# Patient Record
Sex: Male | Born: 1985 | Race: Asian | Hispanic: No | Marital: Single
Health system: Southern US, Academic
[De-identification: ages and names within clinical notes are randomized; demographics above are authoritative.]

## PROBLEM LIST (undated history)

## (undated) ENCOUNTER — Encounter: Attending: Gastroenterology | Primary: Gastroenterology

## (undated) ENCOUNTER — Encounter

## (undated) ENCOUNTER — Ambulatory Visit

## (undated) ENCOUNTER — Encounter
Attending: Pharmacist Clinician (PhC)/ Clinical Pharmacy Specialist | Primary: Pharmacist Clinician (PhC)/ Clinical Pharmacy Specialist

## (undated) ENCOUNTER — Telehealth

## (undated) ENCOUNTER — Telehealth
Attending: Pharmacist Clinician (PhC)/ Clinical Pharmacy Specialist | Primary: Pharmacist Clinician (PhC)/ Clinical Pharmacy Specialist

## (undated) ENCOUNTER — Ambulatory Visit: Attending: Gastroenterology | Primary: Gastroenterology

## (undated) ENCOUNTER — Telehealth: Attending: Gastroenterology | Primary: Gastroenterology

## (undated) DIAGNOSIS — B1910 Unspecified viral hepatitis B without hepatic coma: Secondary | ICD-10-CM

## (undated) MED ORDER — PHYTONADIONE (VITAMIN K1) 100 MCG TABLET: Freq: Every day | ORAL | 0.00000 days

## (undated) MED ORDER — VITAMIN K2 100 MCG CAPSULE: ORAL | 0 days

## (undated) MED ORDER — MAGNESIUM 30 MG TABLET: Freq: Two times a day (BID) | ORAL | 0.00000 days

## (undated) MED ORDER — VITAMIN D3 ORAL: ORAL | 0 days

---
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
## 2007-08-20 ENCOUNTER — Ambulatory Visit (HOSPITAL_COMMUNITY): Admission: RE | Admit: 2007-08-20 | Discharge: 2007-08-20 | Payer: Self-pay | Admitting: Internal Medicine

## 2010-04-01 ENCOUNTER — Ambulatory Visit: Payer: Self-pay | Admitting: Gastroenterology

## 2010-04-15 ENCOUNTER — Emergency Department (HOSPITAL_COMMUNITY): Admission: EM | Admit: 2010-04-15 | Discharge: 2010-04-15 | Payer: Self-pay | Admitting: Emergency Medicine

## 2010-07-15 ENCOUNTER — Emergency Department (HOSPITAL_COMMUNITY): Admission: EM | Admit: 2010-07-15 | Discharge: 2010-07-15 | Payer: Self-pay | Admitting: Emergency Medicine

## 2011-01-20 LAB — CBC
Hemoglobin: 16.3 g/dL (ref 13.0–17.0)
MCV: 78.1 fL (ref 78.0–100.0)
RBC: 6.13 MIL/uL — ABNORMAL HIGH (ref 4.22–5.81)
RDW: 14.1 % (ref 11.5–15.5)
WBC: 7.7 10*3/uL (ref 4.0–10.5)

## 2011-01-20 LAB — COMPREHENSIVE METABOLIC PANEL
ALT: 42 U/L (ref 0–53)
AST: 28 U/L (ref 0–37)
Albumin: 3.7 g/dL (ref 3.5–5.2)
Calcium: 9.2 mg/dL (ref 8.4–10.5)
GFR calc Af Amer: >60 mL/min (ref >60)
Potassium: 3.7 mEq/L (ref 3.5–5.1)
Total Bilirubin: 0.5 mg/dL (ref 0.3–1.2)
Total Protein: 6.8 g/dL (ref 6.0–8.3)

## 2011-01-20 LAB — URINALYSIS, ROUTINE W REFLEX MICROSCOPIC
Bilirubin Urine: NEGATIVE
Glucose, UA: NEGATIVE mg/dL
Ketones, ur: NEGATIVE mg/dL
Protein, ur: NEGATIVE mg/dL
Specific Gravity, Urine: 1.013 (ref 1.005–1.030)
pH: 6.5 (ref 5.0–8.0)

## 2011-01-20 LAB — LIPASE, BLOOD: Lipase: 34 U/L (ref 11–59)

## 2011-01-24 LAB — DIFFERENTIAL
Basophils Absolute: 0.1 10*3/uL (ref 0.0–0.1)
Basophils Relative: 1 % (ref 0–1)
Lymphocytes Relative: 39 % (ref 12–46)
Monocytes Absolute: 0.8 10*3/uL (ref 0.1–1.0)
Neutro Abs: 3.1 10*3/uL (ref 1.7–7.7)
Neutrophils Relative %: 46 % (ref 43–77)

## 2011-01-24 LAB — URINALYSIS, ROUTINE W REFLEX MICROSCOPIC: Hgb urine dipstick: NEGATIVE

## 2011-01-24 LAB — CBC
Hemoglobin: 16.5 g/dL (ref 13.0–17.0)
MCHC: 33.3 g/dL (ref 30.0–36.0)
MCV: 80.9 fL (ref 78.0–100.0)
Platelets: 120 10*3/uL — ABNORMAL LOW (ref 150–400)
RBC: 6.13 MIL/uL — ABNORMAL HIGH (ref 4.22–5.81)
RDW: 14.5 % (ref 11.5–15.5)

## 2011-01-24 LAB — COMPREHENSIVE METABOLIC PANEL
ALT: 840 U/L — ABNORMAL HIGH (ref 0–53)
Albumin: 3.8 g/dL (ref 3.5–5.2)
Alkaline Phosphatase: 135 U/L — ABNORMAL HIGH (ref 39–117)
CO2: 30 mEq/L (ref 19–32)
Chloride: 103 mEq/L (ref 96–112)
Glucose, Bld: 132 mg/dL — ABNORMAL HIGH (ref 70–99)
Sodium: 137 mEq/L (ref 135–145)
Total Protein: 7.3 g/dL (ref 6.0–8.3)

## 2011-01-24 LAB — URINE CULTURE

## 2011-06-26 IMAGING — US US ABDOMEN COMPLETE
1 series · 13 of 25 positions shown · non-contrast
Comparison: 08/20/2007

CLINICAL DATA: 23-year-old male with abdominal pain. History of
hepatitis B.

ABDOMINAL ULTRASOUND COMPLETE

[Series 1: us abdomen complete · 0.23mm/px · 13 of 65 slices shown]
[im 1/65]
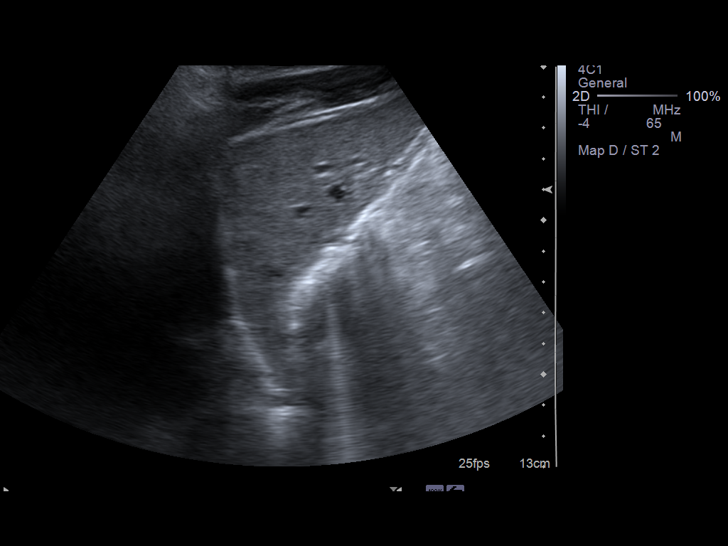
[im 6/65]
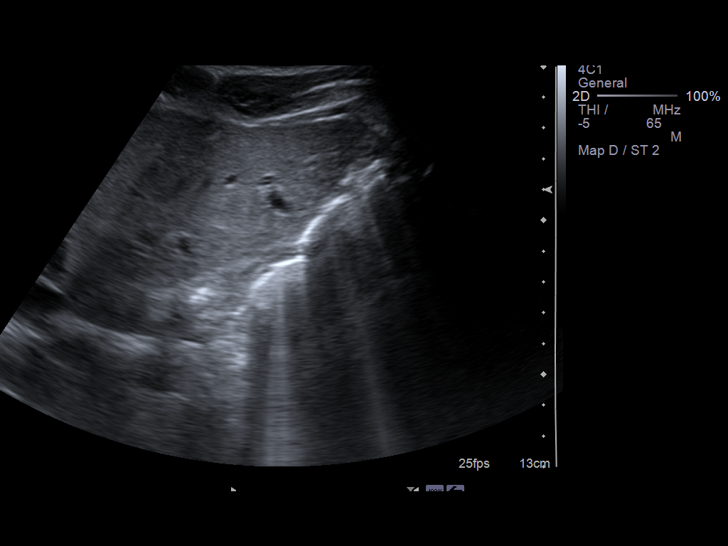
[im 11/65]
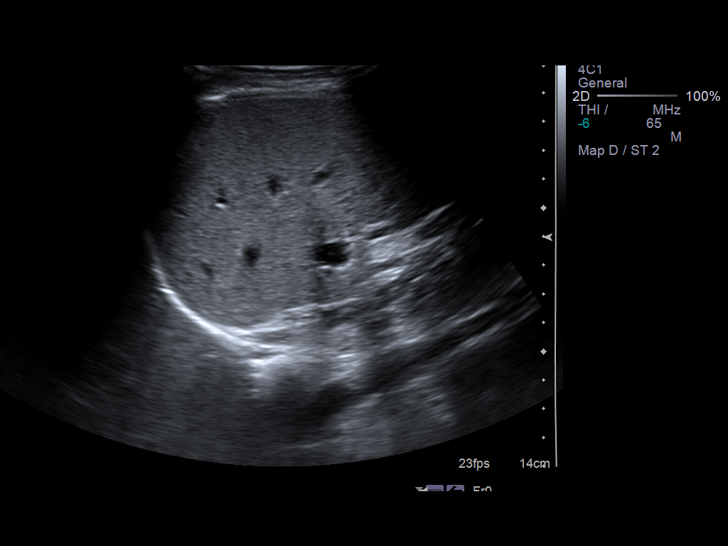
[im 17/65]
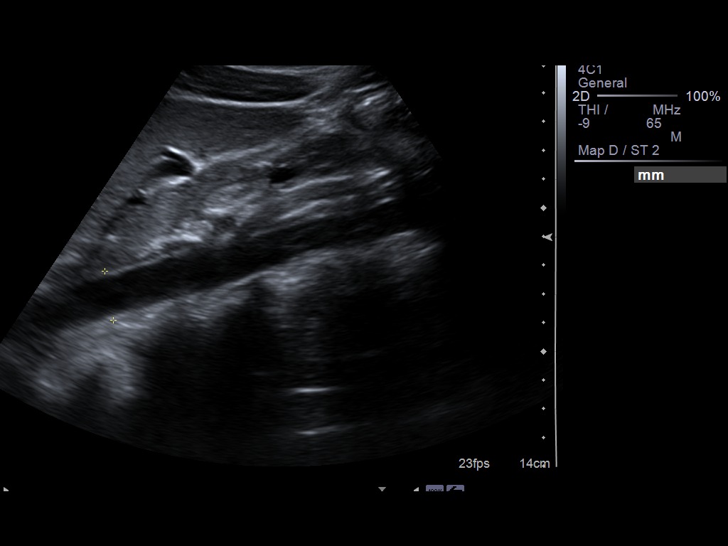
[im 22/65]
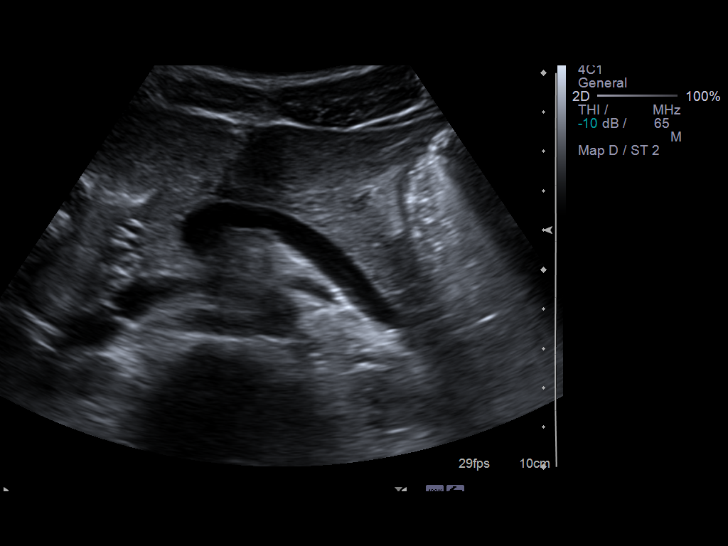
[im 27/65]
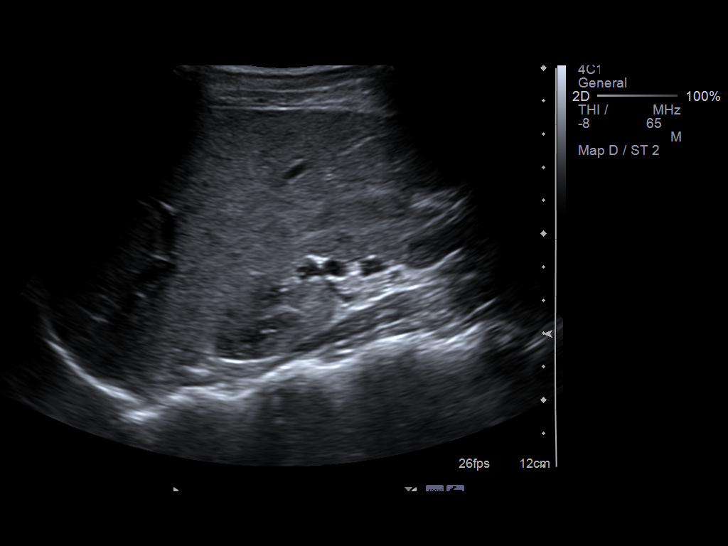
[im 33/65]
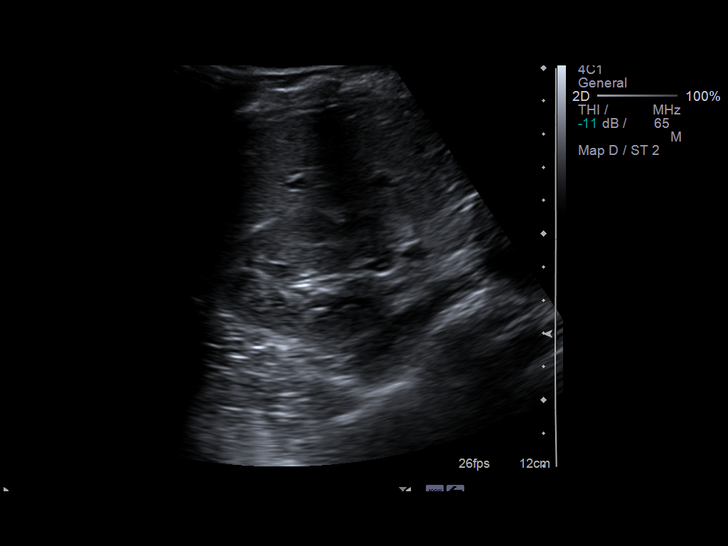
[im 38/65]
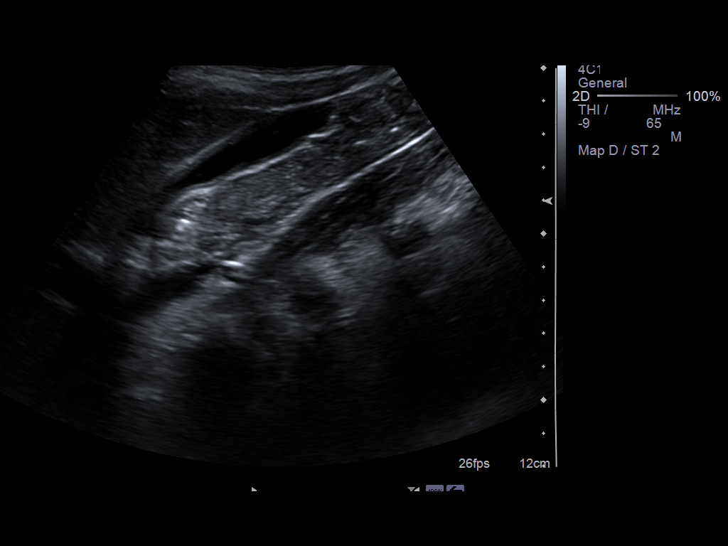
[im 43/65]
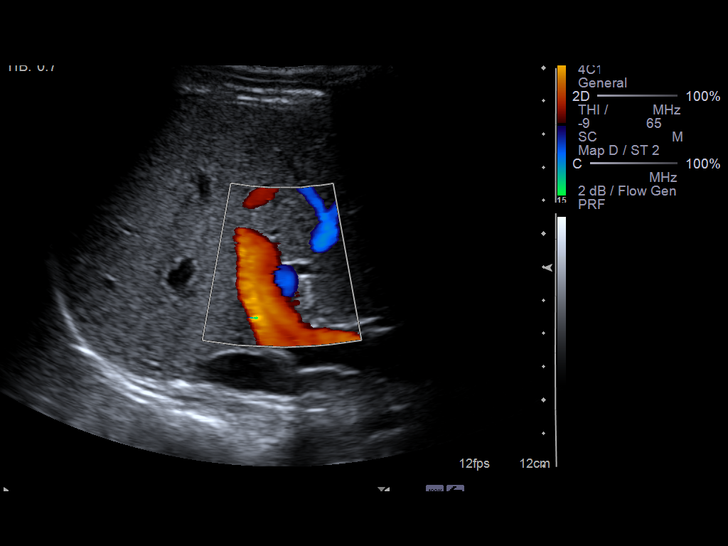
[im 49/65]
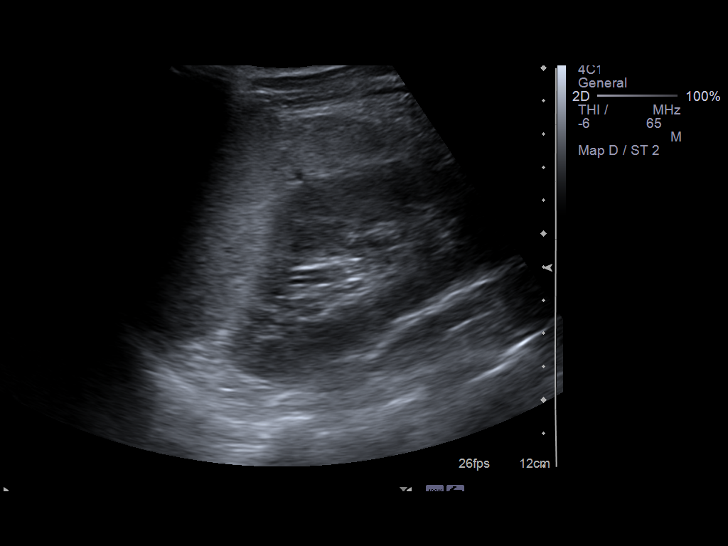
[im 54/65]
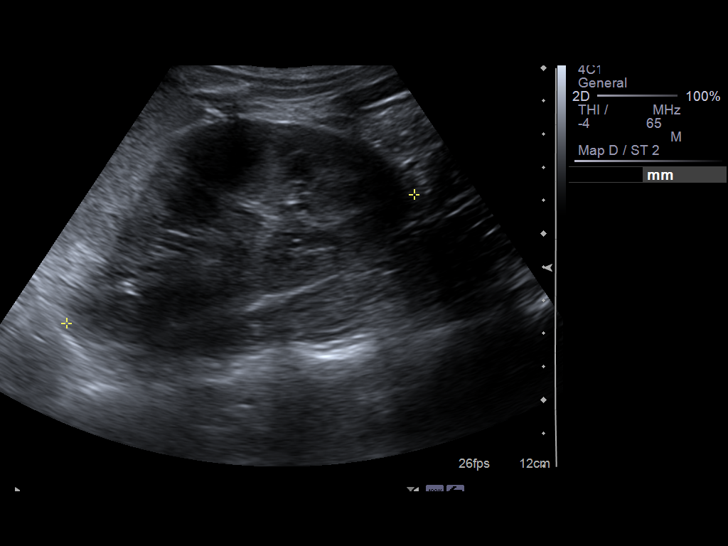
[im 59/65]
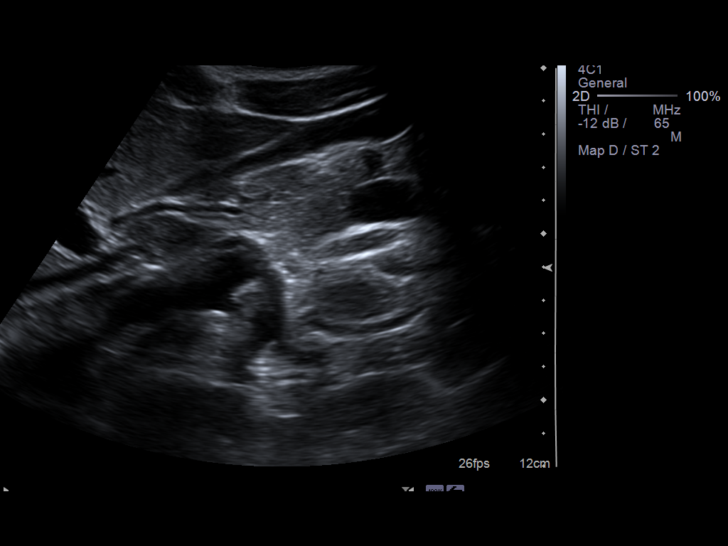
[im 65/65]
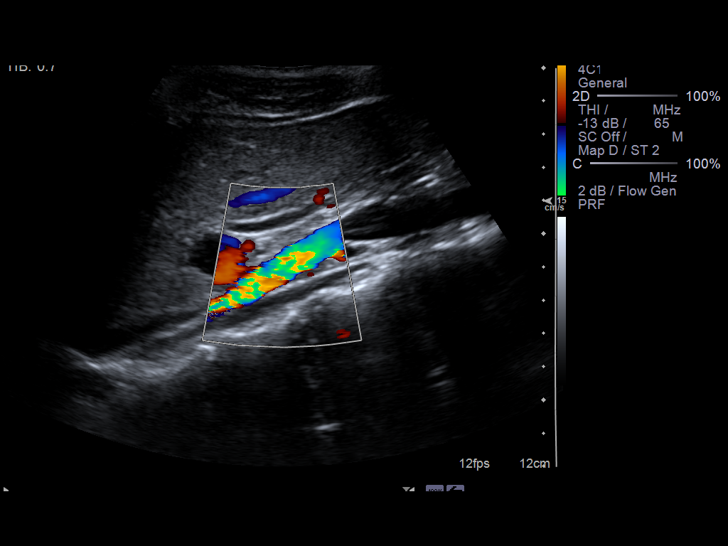

[13 of 25 positions shown; findings below may reference images not displayed]

FINDINGS: Gallbladder:  The gallbladder is contracted but unremarkable. There
is no evidence of gallstones, gallbladder wall thickening, or
pericholecystic fluid.

Common Bile Duct:  There is no evidence of intrahepatic or
extrahepatic biliary dilation. The CBD measures 3.0 mm in greatest
diameter.

Liver:  The liver is within normal limits in parenchymal
echogenicity. No focal abnormalities are identified.

IVC:  Appears normal.

Pancreas:  Although the pancreas is difficult to visualize in its
entirety, no focal pancreatic abnormality is identified.

Spleen:  Within normal limits in size and echotexture.

Right kidney:  The right kidney is normal in size and parenchymal
echogenicity.  There is no evidence of solid mass, hydronephrosis
or definite renal calculi.  The right kidney measures 11.5 cm.

Left kidney:  The left kidney is normal in size and parenchymal
echogenicity.  There is no evidence of solid mass, hydronephrosis
or definite renal calculi.   The left kidney measures 11.2 cm.

Abdominal Aorta:  No abdominal aortic aneurysm identified.

There is no evidence of ascites.
IMPRESSION: Negative abdominal ultrasound.

## 2012-03-15 ENCOUNTER — Other Ambulatory Visit: Payer: Self-pay | Admitting: Gastroenterology

## 2012-03-15 DIAGNOSIS — B1910 Unspecified viral hepatitis B without hepatic coma: Secondary | ICD-10-CM

## 2012-03-19 ENCOUNTER — Ambulatory Visit (HOSPITAL_COMMUNITY)
Admission: RE | Admit: 2012-03-19 | Discharge: 2012-03-19 | Disposition: A | Discharge status: Home / Self Care | Payer: 59 | Source: Ambulatory Visit | Attending: Gastroenterology | Admitting: Gastroenterology

## 2012-03-19 DIAGNOSIS — B1910 Unspecified viral hepatitis B without hepatic coma: Secondary | ICD-10-CM | POA: Insufficient documentation

## 2013-05-30 IMAGING — US US ABDOMEN COMPLETE
1 series · 14 of 25 positions shown · non-contrast
Comparison: 04/15/2010

CLINICAL DATA: History of hepatitis B

ABDOMINAL ULTRASOUND COMPLETE

[Series 1: us abdomen complete · 0.25mm/px · 14 of 67 slices shown]
[im 1/67]
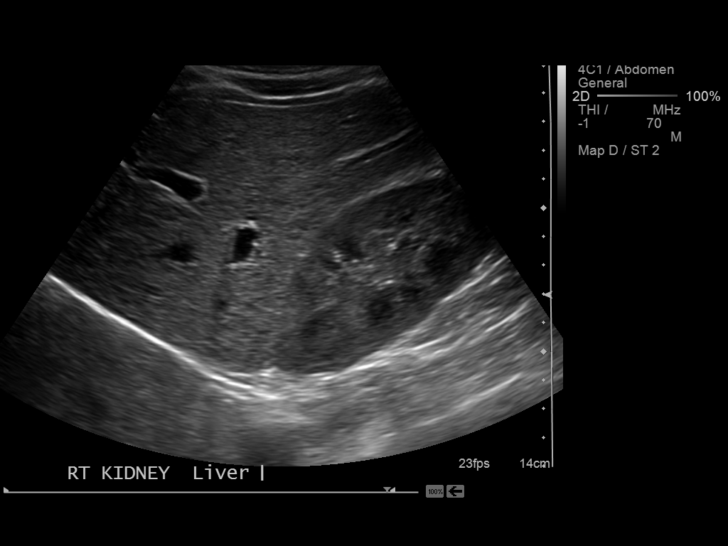
[im 6/67]
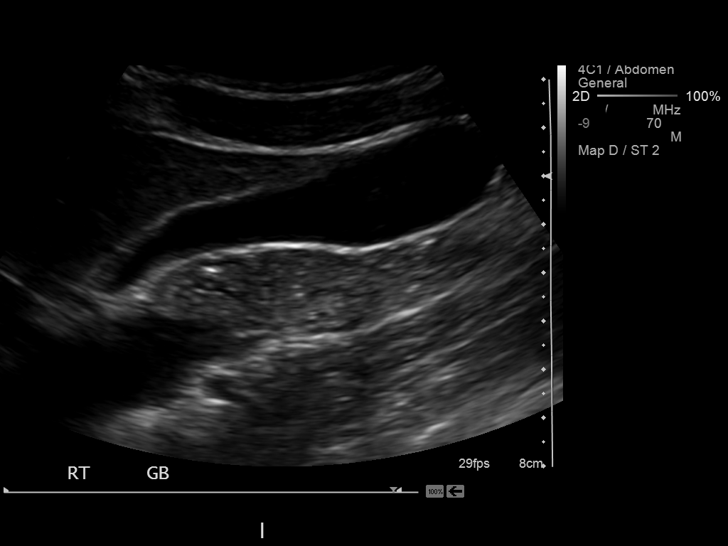
[im 12/67]
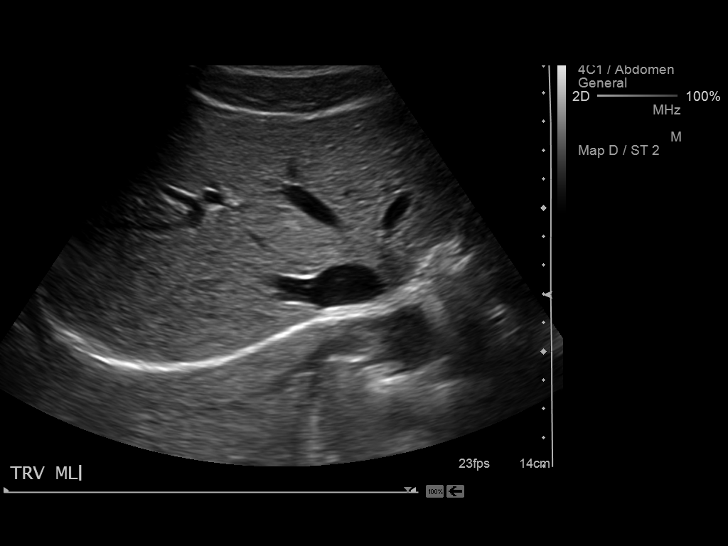
[im 17/67]
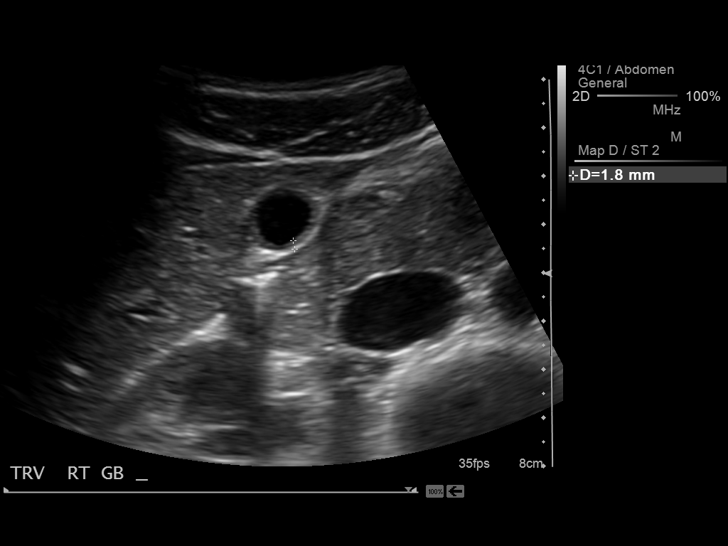
[im 23/67]
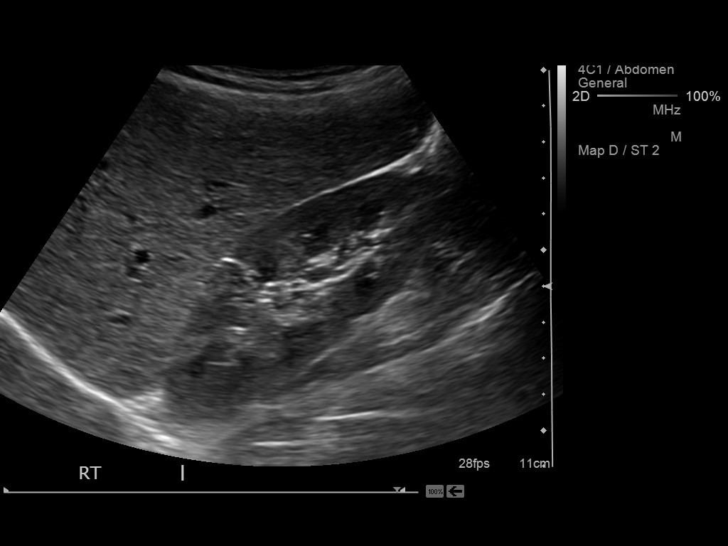
[im 25/67]
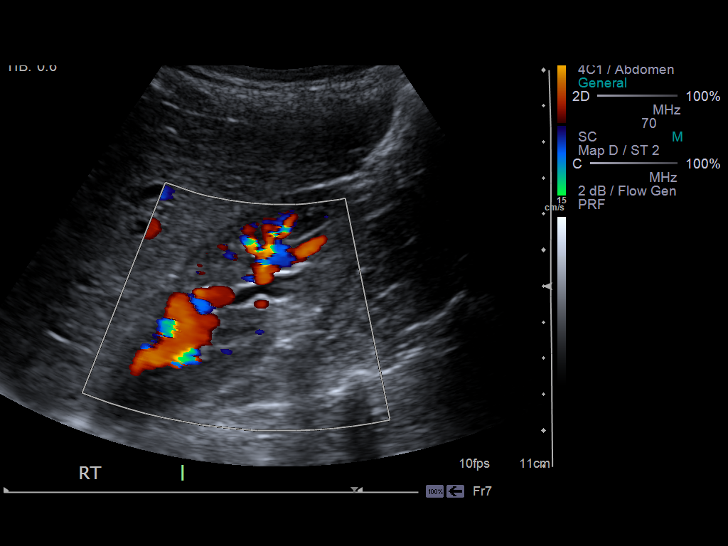
[im 31/67]
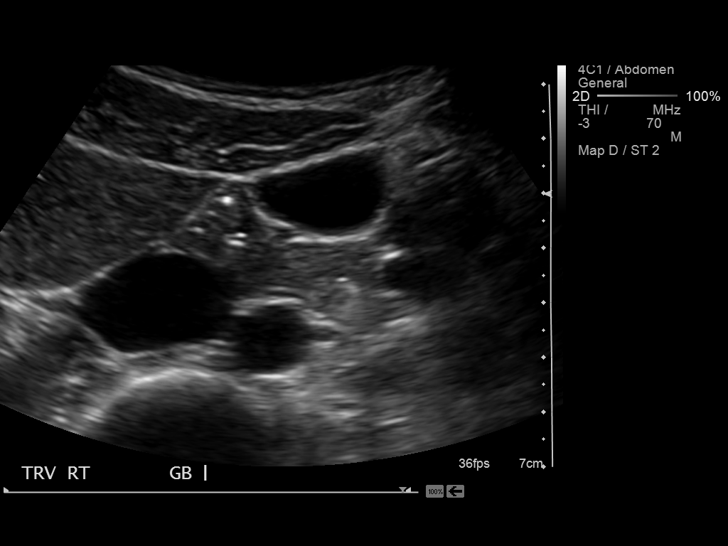
[im 36/67]
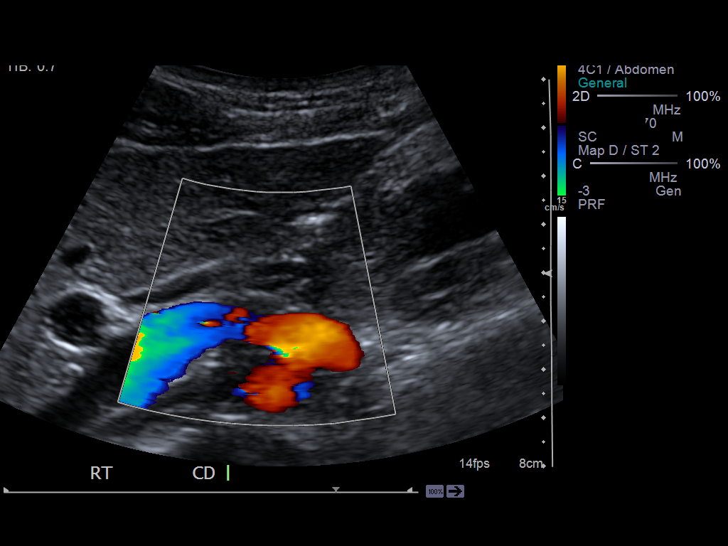
[im 42/67]
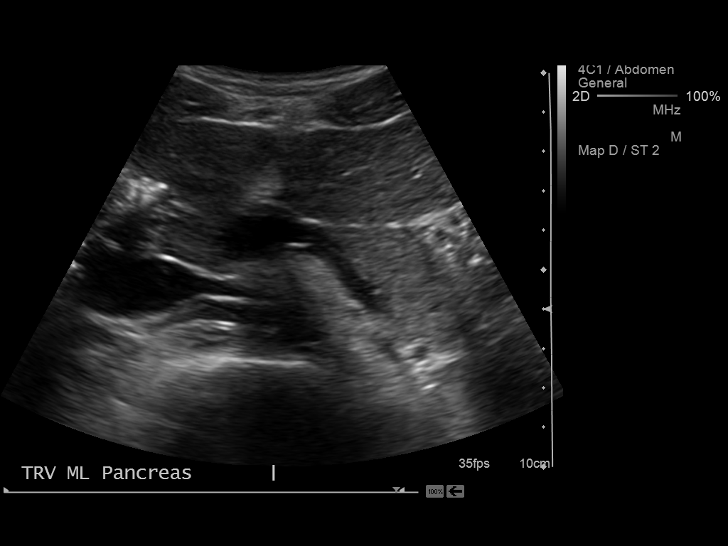
[im 45/67]
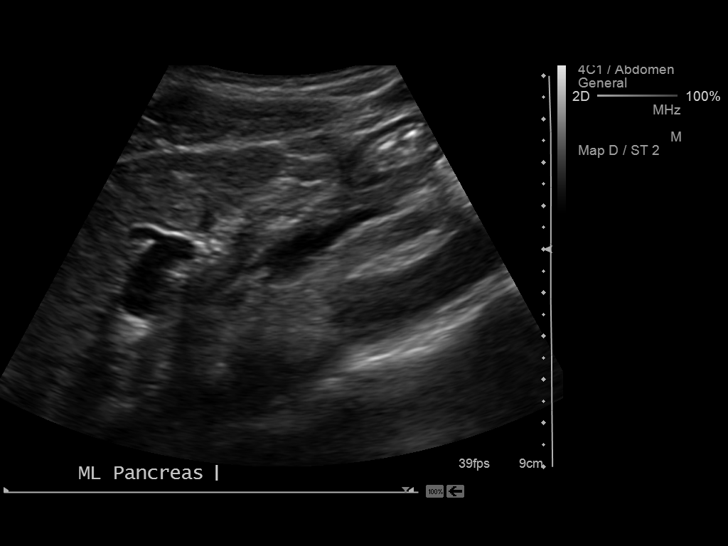
[im 50/67]
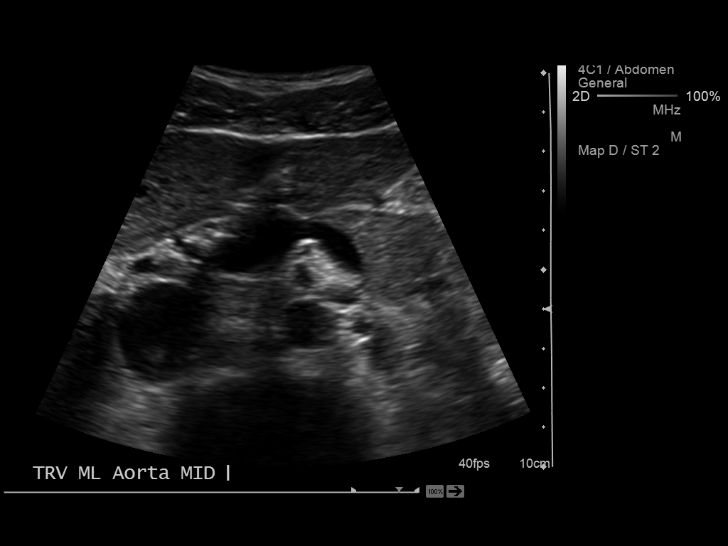
[im 56/67]
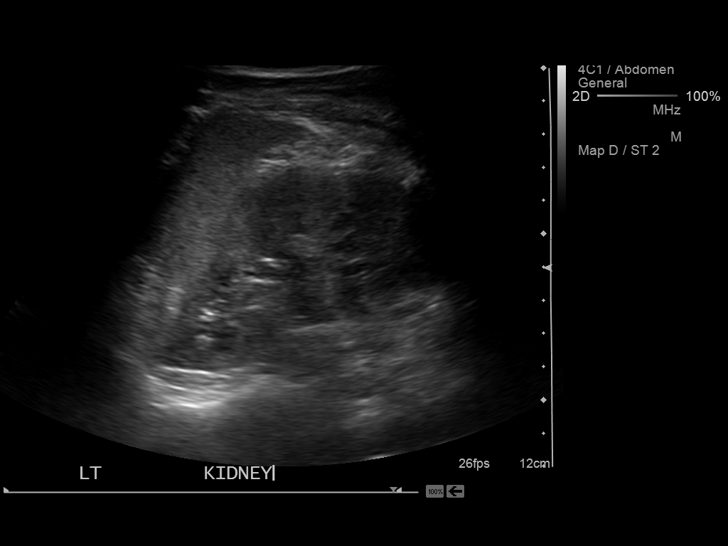
[im 61/67]
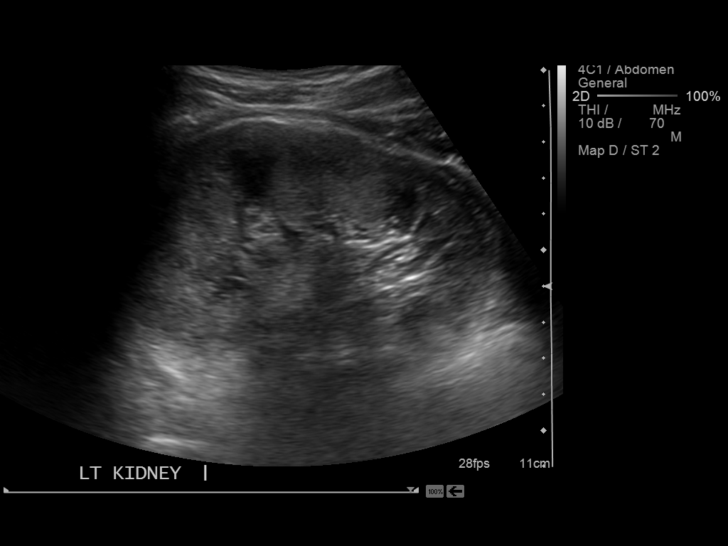
[im 67/67]
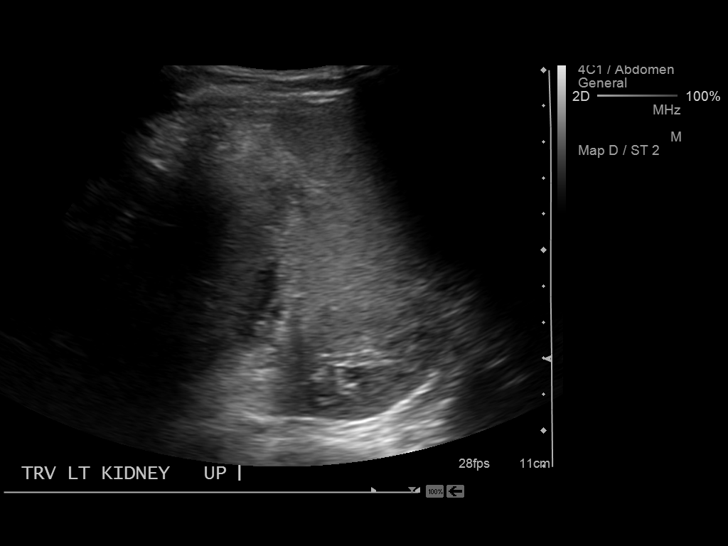

[14 of 25 positions shown; findings below may reference images not displayed]

FINDINGS: Gallbladder:  No gallstones, gallbladder wall thickening, or
pericholecystic fluid.

Common Bile Duct:  Within normal limits in caliber.

Liver: No focal mass lesion identified.  Within normal limits in
parenchymal echogenicity.

IVC:  Appears normal.

Pancreas:  No abnormality identified.

Spleen:  Within normal limits in size and echotexture.

Right kidney:  Normal in size and parenchymal echogenicity.  No
evidence of mass or hydronephrosis.

Left kidney:  Normal in size and parenchymal echogenicity.  No
evidence of mass or hydronephrosis.

Abdominal Aorta:  No aneurysm identified.
IMPRESSION: Negative abdominal ultrasound.

## 2016-02-09 ENCOUNTER — Emergency Department (HOSPITAL_COMMUNITY)
Admission: EM | Admit: 2016-02-09 | Discharge: 2016-02-09 | Disposition: A | Discharge status: Home / Self Care | Payer: 59 | Attending: Emergency Medicine | Admitting: Emergency Medicine

## 2016-02-09 ENCOUNTER — Encounter (HOSPITAL_COMMUNITY): Payer: Self-pay

## 2016-02-09 ENCOUNTER — Emergency Department (HOSPITAL_COMMUNITY): Payer: 59

## 2016-02-09 DIAGNOSIS — Z8619 Personal history of other infectious and parasitic diseases: Secondary | ICD-10-CM | POA: Insufficient documentation

## 2016-02-09 DIAGNOSIS — Z79899 Other long term (current) drug therapy: Secondary | ICD-10-CM | POA: Diagnosis not present

## 2016-02-09 DIAGNOSIS — R1030 Lower abdominal pain, unspecified: Secondary | ICD-10-CM | POA: Diagnosis not present

## 2016-02-09 DIAGNOSIS — R109 Unspecified abdominal pain: Secondary | ICD-10-CM | POA: Diagnosis present

## 2016-02-09 DIAGNOSIS — R000 Tachycardia, unspecified: Secondary | ICD-10-CM | POA: Diagnosis not present

## 2016-02-09 DIAGNOSIS — F17200 Nicotine dependence, unspecified, uncomplicated: Secondary | ICD-10-CM | POA: Insufficient documentation

## 2016-02-09 DIAGNOSIS — R1033 Periumbilical pain: Secondary | ICD-10-CM | POA: Insufficient documentation

## 2016-02-09 HISTORY — DX: Unspecified viral hepatitis B without hepatic coma: B19.10

## 2016-02-09 LAB — URINALYSIS, ROUTINE W REFLEX MICROSCOPIC
Bilirubin Urine: NEGATIVE
GLUCOSE, UA: NEGATIVE mg/dL
Hgb urine dipstick: NEGATIVE
KETONES UR: 15 mg/dL — AB
LEUKOCYTES UA: NEGATIVE
NITRITE: NEGATIVE
PH: 5.5 (ref 5.0–8.0)
Protein, ur: NEGATIVE mg/dL
SPECIFIC GRAVITY, URINE: 1.023 (ref 1.005–1.030)

## 2016-02-09 LAB — COMPREHENSIVE METABOLIC PANEL
ALBUMIN: 4.5 g/dL (ref 3.5–5.0)
ALT: 43 U/L (ref 17–63)
AST: 32 U/L (ref 15–41)
Alkaline Phosphatase: 59 U/L (ref 38–126)
Anion gap: 11 (ref 5–15)
BUN: 11 mg/dL (ref 6–20)
CHLORIDE: 100 mmol/L — AB (ref 101–111)
CO2: 28 mmol/L (ref 22–32)
CREATININE: 1 mg/dL (ref 0.61–1.24)
Calcium: 9.2 mg/dL (ref 8.9–10.3)
GFR calc Af Amer: >60 mL/min (ref >60)
GFR calc non Af Amer: >60 mL/min (ref >60)
GLUCOSE: 78 mg/dL (ref 65–99)
POTASSIUM: 3.5 mmol/L (ref 3.5–5.1)
SODIUM: 139 mmol/L (ref 135–145)
Total Bilirubin: 0.9 mg/dL (ref 0.3–1.2)
Total Protein: 8.1 g/dL (ref 6.5–8.1)

## 2016-02-09 LAB — CBC
HEMATOCRIT: 49 % (ref 39.0–52.0)
Hemoglobin: 16.1 g/dL (ref 13.0–17.0)
MCH: 24.9 pg — ABNORMAL LOW (ref 26.0–34.0)
MCHC: 32.9 g/dL (ref 30.0–36.0)
MCV: 75.7 fL — AB (ref 78.0–100.0)
Platelets: 160 10*3/uL (ref 150–400)
RBC: 6.47 MIL/uL — ABNORMAL HIGH (ref 4.22–5.81)
RDW: 14 % (ref 11.5–15.5)
WBC: 7.8 10*3/uL (ref 4.0–10.5)

## 2016-02-09 LAB — LIPASE, BLOOD: LIPASE: 35 U/L (ref 11–51)

## 2016-02-09 LAB — RAPID STREP SCREEN (MED CTR MEBANE ONLY): Streptococcus, Group A Screen (Direct): NEGATIVE

## 2016-02-09 LAB — MONONUCLEOSIS SCREEN: MONO SCREEN: NEGATIVE

## 2016-02-09 MED ORDER — IBUPROFEN 800 MG PO TABS
800.0000 mg | ORAL_TABLET | Freq: Once | ORAL | Status: AC
  Administered 2016-02-09: 800 mg via ORAL
  Filled 2016-02-09: qty 1

## 2016-02-09 MED ORDER — TRAMADOL HCL 50 MG PO TABS: 50.0000 mg | ORAL_TABLET | Freq: Four times a day (QID) | ORAL | Status: DC | PRN

## 2016-02-09 NOTE — ED Notes (Addendum)
Pt reports all over abdominal pain that originated in his upper back, onset on Friday. Pt denies N/V/D but reports sore throat. He went to Urgent Care yesterday and was told to come to ER if symptoms do not resolve.

## 2016-02-09 NOTE — ED Notes (Signed)
Pt ambulates independently and with steady gait at time of discharge. Discharge instructions and follow up information reviewed with patient. No other questions or concerns voiced at this time. RX x 1. 

## 2016-02-09 NOTE — Discharge Instructions (Signed)

## 2016-02-09 NOTE — ED Provider Notes (Signed)
CSN: [id number]     Arrival date & time 02/09/16  1552 History   First MD Initiated Contact with Patient 02/09/16 1929     Chief Complaint  Patient presents with  . Abdominal Pain     (Consider location/radiation/quality/duration/timing/severity/associated sxs/prior Treatment) HPI Comments: The patient is a 30 year old male with a history of hepatitis B diagnosed approximately 6 years ago he presents to the hospital today with abdominal pain which is been present for 5 days. Initially this started in his chest and radiated to his back but over the last few days it is moved down towards his belly button. He is able to eat and drink, there is no nausea vomiting or diarrhea but he has developed a sore throat. He went to the urgent care and was told to come here if his symptoms worsened yesterday. He states that there gradually worsening, but the symptoms are relatively the same, he has not taken any medications for this. He denies coughing, swelling, rashes, dysuria but states his stools have gone from normal bulky brown stools 2 slightly loose and falling apart  Patient is a 30 y.o. male presenting with abdominal pain. The history is provided by the patient.  Abdominal Pain   Past Medical History  Diagnosis Date  . Hepatitis B    History reviewed. No pertinent past surgical history. No family history on file. Social History  Substance Use Topics  . Smoking status: Current Every Day Smoker  . Smokeless tobacco: None  . Alcohol Use: Yes    Review of Systems  Gastrointestinal: Positive for abdominal pain.  All other systems reviewed and are negative.     Allergies  Review of patient's allergies indicates no known allergies.  Home Medications   Prior to Admission medications   Medication Sig Start Date End Date Taking? Authorizing Provider  tenofovir (VIREAD) 300 MG tablet Take 300 mg by mouth daily.   Yes Historical Provider, MD  traMADol (ULTRAM) 50 MG tablet Take 1 tablet (50  mg total) by mouth every 6 (six) hours as needed. 02/09/16   Eber HongBrian Evania Lyne, MD   BP 112/72 mmHg  Pulse 97  Temp(Src) 98.3 F (36.8 C) (Oral)  Resp 15  SpO2 99% Physical Exam  Constitutional: He appears well-developed and well-nourished. No distress.  HENT:  Head: Normocephalic and atraumatic.  Mouth/Throat: Oropharynx is clear and moist. No oropharyngeal exudate.  Erythema of the posterior pharynx without exudate asymmetry hypertrophy or uvula deviation. There are some aphthous ulcers present  Eyes: Conjunctivae and EOM are normal. Pupils are equal, round, and reactive to light. Right eye exhibits no discharge. Left eye exhibits no discharge. No scleral icterus.  Neck: Normal range of motion. Neck supple. No JVD present. No thyromegaly present.  No lymphadenopathy of the neck other than one posterior cervical lymph node which is nontender but slightly enlarged  Cardiovascular: Regular rhythm, normal heart sounds and intact distal pulses.  Exam reveals no gallop and no friction rub.   No murmur heard. Tachycardic to 110  Pulmonary/Chest: Effort normal and breath sounds normal. No respiratory distress. He has no wheezes. He has no rales.  Abdominal: Soft. Bowel sounds are normal. He exhibits no distension and no mass. There is tenderness ( Mild tenderness in the suprapubic and periumbilical region, no Murphy sign, no pain at McBurney's point, no tenderness in the left upper quadrant).  Musculoskeletal: Normal range of motion. He exhibits no edema or tenderness.  Lymphadenopathy:    He has no cervical adenopathy.  Neurological:  He is alert. Coordination normal.  Skin: Skin is warm and dry. No rash noted. No erythema.  Psychiatric: He has a normal mood and affect. His behavior is normal.  Nursing note and vitals reviewed.   ED Course  Procedures (including critical care time) Labs Review Labs Reviewed  COMPREHENSIVE METABOLIC PANEL - Abnormal; Notable for the following:    Chloride 100  (*)    All other components within normal limits  CBC - Abnormal; Notable for the following:    RBC 6.47 (*)    MCV 75.7 (*)    MCH 24.9 (*)    All other components within normal limits  URINALYSIS, ROUTINE W REFLEX MICROSCOPIC (NOT AT [facility]) - Abnormal; Notable for the following:    Ketones, ur 15 (*)    All other components within normal limits  RAPID STREP SCREEN (NOT AT [facility])  CULTURE, GROUP A STREP ([facility])  LIPASE, BLOOD  MONONUCLEOSIS SCREEN    Imaging Review US Abdomen Limited Ruq  02/09/2016  CLINICAL DATA:  Abdominal pain for 4 days. EXAM: US ABDOMEN LIMITED - RIGHT UPPER QUADRANT COMPARISON:  Abdominal ultrasound 03/19/2012 FINDINGS: Gallbladder: Elongated, unchanged from prior exam. No gallstones or wall thickening visualized. No sonographic Murphy sign noted by sonographer. Common bile duct: Diameter: 1-2 mm, normal. Liver: No focal lesion identified. Within normal limits in parenchymal echogenicity. Normal directional flow in the main portal vein. IMPRESSION: Normal right upper quadrant ultrasound. Electronically Signed   By: Rubye Oaks M.D.   On: 02/09/2016 22:19   I have personally reviewed and evaluated these images and lab results as part of my medical decision-making.    MDM   Final diagnoses:  Abdominal pain    The patient is well-appearing overall, he does not have a surgical abdomen, he does not have a leukocytosis and his liver function is normal. Lab work is reassuring, obtain ultrasound, Monospot and strep swab. The patient is agreeable to Motrin  Mono neg Strep neg [place] neg Labs reassuring - explained all to pt - in agreement to f/u closely.  Meds given in ED:  Medications  ibuprofen (ADVIL,MOTRIN) tablet 800 mg (800 mg Oral Given 02/09/16 2001)    New Prescriptions   TRAMADOL (ULTRAM) 50 MG TABLET    Take 1 tablet (50 mg total) by mouth every 6 (six) hours as needed.       Eber Hong, MD 02/09/16 [phone]

## 2016-02-12 ENCOUNTER — Encounter (HOSPITAL_COMMUNITY): Payer: Self-pay | Admitting: *Deleted

## 2016-02-12 ENCOUNTER — Emergency Department (HOSPITAL_COMMUNITY)
Admission: EM | Admit: 2016-02-12 | Discharge: 2016-02-13 | Disposition: A | Discharge status: Home / Self Care | Payer: 59 | Attending: Emergency Medicine | Admitting: Emergency Medicine

## 2016-02-12 DIAGNOSIS — F17200 Nicotine dependence, unspecified, uncomplicated: Secondary | ICD-10-CM | POA: Diagnosis not present

## 2016-02-12 DIAGNOSIS — Z79899 Other long term (current) drug therapy: Secondary | ICD-10-CM | POA: Diagnosis not present

## 2016-02-12 DIAGNOSIS — Z8619 Personal history of other infectious and parasitic diseases: Secondary | ICD-10-CM | POA: Insufficient documentation

## 2016-02-12 DIAGNOSIS — R112 Nausea with vomiting, unspecified: Secondary | ICD-10-CM | POA: Diagnosis not present

## 2016-02-12 DIAGNOSIS — J111 Influenza due to unidentified influenza virus with other respiratory manifestations: Secondary | ICD-10-CM | POA: Insufficient documentation

## 2016-02-12 DIAGNOSIS — R509 Fever, unspecified: Secondary | ICD-10-CM | POA: Diagnosis present

## 2016-02-12 LAB — CBC WITH DIFFERENTIAL/PLATELET
BASOS ABS: 0 10*3/uL (ref 0.0–0.1)
Basophils Relative: 0 %
EOS ABS: 0 10*3/uL (ref 0.0–0.7)
EOS PCT: 0 %
HCT: 47.2 % (ref 39.0–52.0)
Hemoglobin: 15.7 g/dL (ref 13.0–17.0)
LYMPHS PCT: 29 %
Lymphs Abs: 1.3 10*3/uL (ref 0.7–4.0)
MCH: 25 pg — AB (ref 26.0–34.0)
MCHC: 33.3 g/dL (ref 30.0–36.0)
MCV: 75 fL — AB (ref 78.0–100.0)
MONO ABS: 1.1 10*3/uL — AB (ref 0.1–1.0)
Monocytes Relative: 23 %
Neutro Abs: 2.1 10*3/uL (ref 1.7–7.7)
Neutrophils Relative %: 48 %
PLATELETS: 122 10*3/uL — AB (ref 150–400)
RBC: 6.29 MIL/uL — AB (ref 4.22–5.81)
RDW: 13.5 % (ref 11.5–15.5)
WBC: 4.5 10*3/uL (ref 4.0–10.5)

## 2016-02-12 LAB — CULTURE, GROUP A STREP (THRC)

## 2016-02-12 LAB — I-STAT CG4 LACTIC ACID, ED: LACTIC ACID, VENOUS: 1.25 mmol/L (ref 0.5–2.0)

## 2016-02-12 MED ORDER — ACETAMINOPHEN 325 MG PO TABS
650.0000 mg | ORAL_TABLET | Freq: Once | ORAL | Status: AC | PRN
  Administered 2016-02-12: 650 mg via ORAL
  Filled 2016-02-12: qty 2

## 2016-02-12 MED ORDER — ONDANSETRON 4 MG PO TBDP
8.0000 mg | ORAL_TABLET | Freq: Once | ORAL | Status: AC
  Administered 2016-02-13: 8 mg via ORAL
  Filled 2016-02-12: qty 2

## 2016-02-12 MED ORDER — ONDANSETRON HCL 8 MG PO TABS: 8.0000 mg | ORAL_TABLET | Freq: Three times a day (TID) | ORAL | Status: DC | PRN

## 2016-02-12 NOTE — ED Provider Notes (Signed)
CSN: [id number]     Arrival date & time 02/12/16  2017 History  By signing my name below, I, Placido SouLogan Joldersma, attest that this documentation has been prepared under the direction and in the presence of Mancel BaleElliott Oluwateniola Leitch, MD. Electronically Signed: Placido SouLogan Joldersma, ED Scribe. 02/12/2016. 11:44 PM.   Chief Complaint  Patient presents with  . multiple complaints    The history is provided by the patient. No language interpreter was used.    HPI Comments: Dillon Estrada is a 30 y.o. male with a PMHx of hepatitis B diagnosed 6 years ago who presents to the Emergency Department with multiple complaints onset 1 week ago. Pt [facility] and received a CXR with no acute abnormalities and was seen again 3 days ago with a fever, n/v, abd pain and decreased appetite. He now additionally complains of generalized weakness, sore throat, rhinorrhea and fatigue. Pt reports taking tenofovir for his hepatitis B and [facility]. He denies any diarrhea, dizziness or any other associated symptoms at this time.    Past Medical History  Diagnosis Date  . Hepatitis B    History reviewed. No pertinent past surgical history. No family history on file. Social History  Substance Use Topics  . Smoking status: Current Every Day Smoker  . Smokeless tobacco: None  . Alcohol Use: Yes    Review of Systems  Constitutional: Positive for fever and fatigue.  HENT: Positive for congestion, rhinorrhea and sore throat.   Gastrointestinal: Positive for nausea and vomiting. Negative for diarrhea.  Neurological: Positive for weakness. Negative for dizziness.  All other systems reviewed and are negative.  Allergies  Review of patient's allergies indicates no known allergies.  Home Medications   Prior to Admission medications   Medication Sig Start Date End Date Taking? Authorizing Provider  ondansetron (ZOFRAN) 8 MG tablet Take 1 tablet (8 mg total) by mouth every 8 (eight)  hours as needed for nausea or vomiting. 02/12/16   Mancel BaleElliott Ilka Lovick, MD  tenofovir (VIREAD) 300 MG tablet Take 300 mg by mouth daily.    Historical Provider, MD  traMADol (ULTRAM) 50 MG tablet Take 1 tablet (50 mg total) by mouth every 6 (six) hours as needed. 02/09/16   Eber HongBrian Miller, MD   BP 114/74 mmHg  Pulse 91  Temp(Src) 98.8 F (37.1 C) (Oral)  Resp 18  SpO2 98% Physical Exam  Constitutional: He is oriented to person, place, and time. He appears well-developed and well-nourished.  HENT:  Head: Normocephalic and atraumatic.  Right Ear: External ear normal.  Left Ear: External ear normal.  Nose: Rhinorrhea (clear) present.  Eyes: Conjunctivae and EOM are normal. Pupils are equal, round, and reactive to light.  Neck: Normal range of motion and phonation normal. Neck supple.  Cardiovascular: Normal rate, regular rhythm and normal heart sounds.   Pulmonary/Chest: Effort normal and breath sounds normal. He exhibits no bony tenderness.  Abdominal: Soft. There is no tenderness.  Genitourinary:  No CVA tenderness  Musculoskeletal: Normal range of motion. He exhibits no tenderness.  No musculoskeletal tenderness of back   Neurological: He is alert and oriented to person, place, and time. No cranial nerve deficit or sensory deficit. He exhibits normal muscle tone. Coordination normal.  Skin: Skin is warm, dry and intact.  Psychiatric: He has a normal mood and affect. His behavior is normal. Judgment and thought content normal.  Nursing note and vitals reviewed.  ED Course  Procedures  DIAGNOSTIC STUDIES: Oxygen Saturation is 99% on RA, normal by my interpretation.    COORDINATION OF CARE: 11:42 PM Discussed next steps with pt. He verbalized understanding and is agreeable with the plan.   Medications  acetaminophen (TYLENOL) tablet 650 mg (650 mg Oral Given 02/12/16 2051)  ondansetron (ZOFRAN-ODT) disintegrating tablet 8 mg (8 mg Oral Given 02/13/16 0004)    Patient Vitals for the past 24  hrs:  BP Temp Temp src Pulse Resp SpO2  02/12/16 2320 114/74 mmHg 98.8 F (37.1 C) Oral 91 18 98 %  02/12/16 2035 113/77 mmHg 101.3 F (38.5 C) Oral 107 20 99 %    At discharge-  Reevaluation with update and discussion. After initial assessment and treatment, an updated evaluation reveals no further complaints. Findings discussed with patient. Alyzza Andringa L    Labs Review Labs Reviewed  CBC WITH DIFFERENTIAL/PLATELET - Abnormal; Notable for the following:    RBC 6.29 (*)    MCV 75.0 (*)    MCH 25.0 (*)    Platelets 122 (*)    Monocytes Absolute 1.1 (*)    All other components within normal limits  I-STAT CG4 LACTIC ACID, ED    Imaging Review No results found. I have personally reviewed and evaluated these images and lab results as part of my medical decision-making.   EKG Interpretation None      MDM   Final diagnoses:  Influenza    Evaluation consistent with viral process, possibly influenza. No evidence for serious bacterial infection or metabolic instability.  Nursing Notes Reviewed/ Care Coordinated Applicable Imaging Reviewed Interpretation of Laboratory Data incorporated into ED treatment  The patient appears reasonably screened and/or stabilized for discharge and I doubt any other medical condition or other [facility] requiring further screening, evaluation, or treatment in the ED at this time prior to discharge.  Plan: Home Medications- OTC analgesia; Home Treatments- rest, fluids; return here if the recommended treatment, does not improve the symptoms; Recommended follow up- PCP prn   I personally performed the services described in this documentation, which was scribed in my presence. The recorded information has been reviewed and is accurate.      Mancel Bale, MD 02/13/16 [phone]

## 2016-02-12 NOTE — ED Notes (Signed)
The pt  Is c/i fever vomiting chest congestion aching all over.  [facility] Tuesday.  He reports that he is no better.  He has not taken advil or tylenol  Since tuesdaty

## 2016-02-12 NOTE — Discharge Instructions (Signed)
Take Tylenol for fever, every 4 hours. Start with a clear liquid diet, and gradually advance to bland foods in 1-2 days.   Influenza, Adult Influenza ("the flu") is a viral infection of the respiratory tract. It occurs more often in winter months because people spend more time in close contact with one another. Influenza can make you feel very sick. Influenza easily spreads from person to person (contagious). CAUSES  Influenza is caused by a virus that infects the respiratory tract. You can catch the virus by breathing in droplets from an infected person's cough or sneeze. You can also catch the virus by touching something that was recently contaminated with the virus and then touching your mouth, nose, or eyes. RISKS AND COMPLICATIONS You may be at risk for a more severe case of influenza if you smoke cigarettes, have diabetes, have chronic heart disease (such as heart failure) or lung disease (such as asthma), or if you have a weakened immune system. Elderly people and pregnant women are also at risk for more serious infections. The most common problem of influenza is a lung infection (pneumonia). Sometimes, this problem can require emergency medical care and may be life threatening. SIGNS AND SYMPTOMS  Symptoms typically last 4 to 10 days and may include:  Fever.  Chills.  Headache, body aches, and muscle aches.  Sore throat.  Chest discomfort and cough.  Poor appetite.  Weakness or feeling tired.  Dizziness.  Nausea or vomiting. DIAGNOSIS  Diagnosis of influenza is often made based on your history and a physical exam. A nose or throat swab test can be done to confirm the diagnosis. TREATMENT  In mild cases, influenza goes away on its own. Treatment is directed at relieving symptoms. For more severe cases, your health care provider may prescribe antiviral medicines to shorten the sickness. Antibiotic medicines are not effective because the infection is caused by a virus, not by  bacteria. HOME CARE INSTRUCTIONS  Take medicines only as directed by your health care provider.  Use a cool mist humidifier to make breathing easier.  Get plenty of rest until your temperature returns to normal. This usually takes 3 to 4 days.  Drink enough fluid to keep your urine clear or pale yellow.  Cover yourmouth and nosewhen coughing or sneezing,and wash your handswellto prevent thevirusfrom spreading.  Stay homefromwork orschool untilthe fever is gonefor at least 591full day. PREVENTION  An annual influenza vaccination (flu shot) is the best way to avoid getting influenza. An annual flu shot is now routinely recommended for all adults in the [place] SEEK MEDICAL CARE IF:  You experiencechest pain, yourcough worsens,or you producemore mucus.  Youhave nausea,vomiting, ordiarrhea.  Your fever returns or gets worse. SEEK IMMEDIATE MEDICAL CARE IF:  You havetrouble breathing, you become short of breath,or your skin ornails becomebluish.  You have severe painor stiffnessin the neck.  You develop a sudden headache, or pain in the face or ear.  You have nausea or vomiting that you cannot control. MAKE SURE YOU:   Understand these instructions.  Will watch your condition.  Will get help right away if you are not doing well or get worse.   This information is not intended to replace advice given to you by your health care provider. Make sure you discuss any questions you have with your health care provider.   Document Released: 10/21/2000 Document Revised: 11/14/2014 Document Reviewed: 01/23/2012 Elsevier Interactive Patient Education [employer]2016 Elsevier Inc.

## 2017-05-24 MED ORDER — TENOFOVIR DISOPROXIL FUMARATE 300 MG TABLET
ORAL_TABLET | Freq: Every day | ORAL | 0 refills | 0 days | Status: CP
Start: 2017-05-24 — End: 2017-06-14

## 2017-05-24 MED FILL — VIREAD/300MG/TAB: VIREAD/300MG/TAB | 30 days supply | Qty: 30 | Fill #0

## 2017-06-14 NOTE — Unmapped (Signed)
Specialty Pharmacy Refill Coordination Note     Steven Nelson is a 31 y.o. male contacted today regarding refills of his specialty medication(s).    Reviewed and verified with patient:     [address]  [name] [address]    Specialty medication(s) and dose(s) confirmed: yes  Changes to medications: no  Changes to insurance: no    Medication Adherence    Patient reported X missed doses in the last month:  0  Specialty Medication:  Viread  Medication Assistance Program  Refill Coordination  Has the Patient's Contact Information Changed:  No  Is the Shipping Address Different:  No  Shipping Information  Delivery Scheduled:  Yes  Delivery Date:  06/23/17  Medications to be Shipped:  Viread          Marzetta Board  Specialty Pharmacy Technician

## 2017-06-15 MED ORDER — VIREAD 300 MG TABLET
ORAL_TABLET | PRN refills | 0.00000 days | Status: CP
Start: 2017-06-15 — End: 2018-03-30

## 2017-06-15 MED ORDER — VIREAD 300 MG TABLET: each | 99 refills | 0 days

## 2017-06-19 ENCOUNTER — Ambulatory Visit
Admission: RE | Admit: 2017-06-19 | Discharge: 2017-06-19 | Disposition: A | Discharge status: Home / Self Care | Payer: Commercial Managed Care - PPO

## 2017-06-19 DIAGNOSIS — B181 Chronic viral hepatitis B without delta-agent: Principal | ICD-10-CM

## 2017-06-19 LAB — LIPID PANEL
CHOLESTEROL: 134 mg/dL (ref 100–199)
LDL CHOLESTEROL CALCULATED: 92 mg/dL (ref 60–99)
NON-HDL CHOLESTEROL: 101 mg/dL
VLDL CHOLESTEROL CAL: 8.8 mg/dL — ABNORMAL LOW (ref 9–40)

## 2017-06-19 LAB — CBC
HEMATOCRIT: 46.6 % (ref 41.0–53.0)
HEMOGLOBIN: 15.8 g/dL (ref 13.5–17.5)
MEAN CORPUSCULAR HEMOGLOBIN CONC: 33.8 g/dL (ref 31.0–37.0)
MEAN CORPUSCULAR HEMOGLOBIN: 25.9 pg — ABNORMAL LOW (ref 26.0–34.0)
MEAN CORPUSCULAR VOLUME: 76.5 fL — ABNORMAL LOW (ref 80.0–100.0)
MEAN PLATELET VOLUME: 7.4 fL (ref 7.0–10.0)
PLATELET COUNT: 216 10*9/L (ref 150–440)
RED CELL DISTRIBUTION WIDTH: 14.1 % (ref 12.0–15.0)
WBC ADJUSTED: 4.9 10*9/L (ref 4.5–11.0)

## 2017-06-19 LAB — GLUCOSE FASTING: Glucose^post CFst:MCnc:Pt:Ser/Plas:Qn:: 90

## 2017-06-19 LAB — WBC ADJUSTED: Lab: 4.9

## 2017-06-19 LAB — AFP-TUMOR MARKER: Alpha-1-Fetoprotein.tumor marker:MCnc:Pt:Ser/Plas:Qn:: 1.43

## 2017-06-19 LAB — ALKALINE PHOSPHATASE: Alkaline phosphatase:CCnc:Pt:Ser/Plas:Qn:: 83

## 2017-06-19 LAB — HDL CHOLESTEROL: Cholesterol.in HDL:MCnc:Pt:Ser/Plas:Qn:: 33 — ABNORMAL LOW

## 2017-06-19 LAB — BILIRUBIN TOTAL: Bilirubin:MCnc:Pt:Ser/Plas:Qn:: 0.3

## 2017-06-19 LAB — EGFR MDRD NON AF AMER: Glomerular filtration rate/1.73 sq M.predicted.non black:ArVRat:Pt:Ser/Plas/Bld:Qn:Creatinine-based formula (MDRD): >=60

## 2017-06-19 LAB — AST (SGOT): Aspartate aminotransferase:CCnc:Pt:Ser/Plas:Qn:: 33

## 2017-06-19 LAB — BLOOD UREA NITROGEN: Urea nitrogen:MCnc:Pt:Ser/Plas:Qn:: 12

## 2017-06-19 LAB — CREATININE: EGFR MDRD NON AF AMER: >=60 mL/min/{1.73_m2} (ref >=60)

## 2017-06-19 LAB — ALT (SGPT): Alanine aminotransferase:CCnc:Pt:Ser/Plas:Qn:: 45

## 2017-06-19 LAB — ALBUMIN: Albumin:MCnc:Pt:Ser/Plas:Qn:: 4.6

## 2017-06-20 LAB — INSULIN LEVEL: Chemistry studies:Cmplx:-:^Patient:Set:: 4.5

## 2017-06-20 NOTE — Unmapped (Unsigned)
[facility]      [phone]      Name:Steven Nelson   UJWJ:[id number]   Age:31 y.o.   Sex:M   Race:Asian       CHIEF COMPLAINT: Study followup HBRN IA protocol.     HISTORY OF PRESENT ILLNESS: Steven Nelson is a 31 y.o.  [place] with chronic HBV originally HBeAg + disease. He is at week #228 of HBV IA treatment study, now on treatment. His first PEG injection 03/30/12 for immune active treatment trial, randomized PEG and tenofovir PEG reduced to 135 mcg for side effects including weight loss. He completed PEG at week 24. He has continued per protocol on tenofovir alone. He has persistently undetectable HBV DNA although his Hep Be Ag status fluctuates from positive to negative at various times.    Steven Nelson presents to clinic today alone. He continues to feel well and tolerating tenofovir without difficulty.  He states he has been 100% adherent to tenofovir since last visit.       PAST MEDICAL HISTORY:   1. Chronic hepatitis B, genotype C.   2. Dyslipidemia.   3. Lactose intolerance   4. 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   5. HCC surveillance: Hepatic U/S 03/01/16- ??Mildly heterogeneous echotexture of the liver without focal hepatic mass.  6. HAV immune  7. 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 He works in Set [employer] currently.      FAMILY HISTORY: There is no family history of hepatocellular carcinoma.     ALLERGIES: No known drug allergies.   CURRENT MEDICATIONS:   Tenofovir daily.       PHYSICAL EXAMINATION:   BP 127/73 (BP Site: R Arm, BP Position: Sitting, BP Cuff Size: Medium)  - Pulse 74  - Temp 36.6 ??C (97.8 ??F) (Oral)  - Resp 12  - Wt 50.5 kg (111 lb 6.4 oz)  - BMI 18.54 kg/m??     GENERAL: This is a thin [place] male in no acute distress.   Pleasant individual in NAD    HEENT: Sclera are anicteric, no temporal muscle loss, oropharynx is negative  NECK: No thyromegaly or lymphadenopathy, No carotid bruits  Chest: Clear to auscultation and percussion  Heart: S1, S2, RR, No murmurs  Abdomen: Soft, non-tender, non-distended, no hepatosplenomegaly, no masses appreciated, no ascites  Skin: No spider angiomata, No rashes  Extremities: Without pedal edema, no palmar erythema  Neuro: Grossly intact, No focal deficits      Labs:  Results for orders placed or performed in visit on 06/19/17   BUN   Result Value Ref Range    BUN 12 7 - 21 mg/dL   Cr - Creatinine   Result Value Ref Range    Creatinine 0.82 0.70 - 1.30 mg/dL    EGFR MDRD Af Amer >=13 >=60 mL/min/1.38m2    EGFR MDRD Non Af Amer >=60 >=60 mL/min/1.15m2   Glucose, Fasting   Result Value Ref Range    Glucose, Fasting 90 65 - 99 mg/dL   Alb - Albumin   Result Value Ref Range    Albumin 4.6 3.5 - 5.0 g/dL   AST   Result Value Ref Range    AST 33 19 - 55 U/L   ALT   Result Value Ref Range    ALT 45 19 - 72 U/L   Alkaline Phosphatase   Result Value Ref Range    Alkaline Phosphatase 83 38 - 126 U/L  T.Bili - Bilirubin, total   Result Value Ref Range    Total Bilirubin 0.3 0.0 - 1.2 mg/dL   CBC   Result Value Ref Range    WBC 4.9 4.5 - 11.0 10*9/L    RBC 6.09 (H) 4.50 - 5.90 10*12/L    HGB 15.8 13.5 - 17.5 g/dL    HCT 16.1 09.6 - 04.5 %    MCV 76.5 (L) 80.0 - 100.0 fL    MCH 25.9 (L) 26.0 - 34.0 pg    MCHC 33.8 31.0 - 37.0 g/dL    RDW 40.9 81.1 - 91.4 %    MPV 7.4 7.0 - 10.0 fL    Platelet 216 150 - 440 10*9/L   Lipid panel, Non-Fasting** (**Incl Chol, Trigly, HDL, LDL)   Result Value Ref Range    Triglycerides 44 1 - 149 mg/dL    Cholesterol 782 956 - 199 mg/dL    HDL 33 (L) 40 - 59 mg/dL    LDL Calculated 92 60 - 99 mg/dL    VLDL Cholesterol Cal 8.8 (L) 9 - 40 mg/dL    Chol/HDL Ratio 4.1 <2.1    Non-HDL Cholesterol 101 mg/dL    FASTING Yes    AFP tumor marker   Result Value Ref Range    AFP-Tumor Marker 1.43 <7.51 ng/mL         ASSESSMENT: Patient is a 31 y.o. Asian male  Week# 336 of HBV Cohort trial. Completed PEG and continues on tenofovir. He has persistently undetectable HBV DNA although his Hep Be Ag status fluctuates from positive to negative at various times.    Plan:  Continue tenofovir  Labs per protocol  F/U per protocol  HCC screening- scheduled for 06/23/2017    Tina Griffiths, PA-C  [facility]  [address] [place], [place] [address]  [place], [place] [address]  Ph: [phone]  Fax: [phone]

## 2017-06-21 LAB — HEPATITIS BE ANTIGEN: Hepatitis B virus little e Ag:PrThr:Pt:Ser/Plas:Ord:IA: NEGATIVE

## 2017-06-21 LAB — HEPATITIS BE ANTIBODY: Hepatitis B virus little e Ab.IgG:PrThr:Pt:Ser:Ord:IA: NEGATIVE

## 2017-06-22 MED FILL — VIREAD/300MG/TAB: VIREAD/300MG/TAB | 30 days supply | Qty: 30 | Fill #0

## 2017-06-23 ENCOUNTER — Ambulatory Visit
Admission: RE | Admit: 2017-06-23 | Discharge: 2017-06-23 | Disposition: A | Discharge status: Home / Self Care | Payer: Commercial Managed Care - PPO

## 2017-06-23 DIAGNOSIS — B181 Chronic viral hepatitis B without delta-agent: Principal | ICD-10-CM

## 2017-06-26 LAB — HBV DNA COMMENT: Lab: 0

## 2017-07-18 NOTE — Unmapped (Signed)
[facility] Specialty Pharmacy Refill and Clinical Coordination Note  Medication(s): Viread    Steven Nelson, DOB: January 02, 1986  Phone: [phone] (home) , Alternate phone contact: N/A  Shipping address: [address]  [place] [address]  Phone or address changes today?: No  All above HIPAA information verified.  Insurance changes? No    Completed refill and clinical call assessment today to schedule patient's medication shipment from the [facility] [phone]).      MEDICATION RECONCILIATION    Confirmed the medication and dosage are correct and have not changed: Yes, regimen is correct and unchanged.    Were there any changes to your medication(s) in the past month:  No, there are no changes reported at this time.    ADHERENCE    Is this medicine transplant or covered by Medicare Part B? No.      Did you miss any doses in the past 4 weeks? No missed doses reported.  Adherence counseling provided? Not needed     SIDE EFFECT MANAGEMENT    Are you tolerating your medication?:  Steven Nelson reports tolerating the medication.  Side effect management discussed: None      Therapy is appropriate and should be continued.    Evidence of clinical benefit: See Epic note from 12/06/16.      FINANCIAL/SHIPPING    Delivery Scheduled: Yes, Expected medication delivery date: 07/25/17   Additional medications refilled: No additional medications/refills needed at this time.    Ha did not have any additional questions at this time.    Delivery address validated in FSI scheduling system: Yes, address listed above is correct.      We will follow up with patient monthly for standard refill processing and delivery.      Thank you,    Carole Binning  PharmD Candidate    Lanney Gins   [facility] Specialty Pharmacist

## 2017-07-23 MED FILL — VIREAD/300MG/TAB: VIREAD/300MG/TAB | 30 days supply | Qty: 30 | Fill #1

## 2017-08-14 NOTE — Unmapped (Signed)
[facility] Specialty Pharmacy Refill Coordination Note  Specialty Medication(s): [name]  Additional Medications shipped: Astin Sayre, DOB: 1986/02/11  Phone: [phone] (home) , Alternate phone contact: N/A  Phone or address changes today?: Yes  All above HIPAA information was verified with patient.  Shipping Address: [address]  [name] [address]   Insurance changes? No    Completed refill call assessment today to schedule patient's medication shipment from the [facility] [phone]).      Confirmed the medication and dosage are correct and have not changed: Yes, regimen is correct and unchanged.    Confirmed patient started or stopped the following medications in the past month:  No, there are no changes reported at this time.    Are you tolerating your medication?:  Alister reports tolerating the medication.    ADHERENCE    (Below is required for Medicare Part B or Transplant patients only - per drug):   How many tablets were dispensed last month: 30  Patient currently has 7 remaining.    Did you miss any doses in the past 4 weeks? No missed doses reported.    FINANCIAL/SHIPPING    Delivery Scheduled: Yes, Expected medication delivery date: 08/22/2017     Gyasi did not have any additional questions at this time.    Delivery address validated in FSI scheduling system: Yes, address listed in FSI is correct.    We will follow up with patient monthly for standard refill processing and delivery.      Thank you,  Dorothea Ogle   [facility] Specialty Technician

## 2017-08-21 MED FILL — VIREAD/300MG/TAB: VIREAD/300MG/TAB | 30 days supply | Qty: 30 | Fill #2

## 2017-09-08 NOTE — Unmapped (Signed)
[facility] Specialty Pharmacy Refill Coordination Note  Specialty Medication(s): Viread 300mg       Steven Nelson, DOB: 1986-10-02  Phone: [phone] (home) , Alternate phone contact: N/A  Phone or address changes today?: No  All above HIPAA information was verified with patient.  Shipping Address: [address]  [name] [address]   Insurance changes? No    Completed refill call assessment today to schedule patient's medication shipment from the [facility] [phone]).      Confirmed the medication and dosage are correct and have not changed: Yes, regimen is correct and unchanged.    Confirmed patient started or stopped the following medications in the past month:  No, there are no changes reported at this time.    Are you tolerating your medication?:  Steven Nelson reports tolerating the medication.    ADHERENCE    (Below is required for Medicare Part B or Transplant patients only - per drug):   How many tablets were dispensed last month: 30  Patient currently has 2 weeks worth of medication remaining    Did you miss any doses in the past 4 weeks? Yes, has missed 2 doses of medication due to him being out of town and forgot to take his medication.     FINANCIAL/SHIPPING    Delivery Scheduled: Yes, Expected medication delivery date: 09/15/2017     Steven Nelson did not have any additional questions at this time.    Delivery address validated in FSI scheduling system: Yes, address listed in FSI is correct.    We will follow up with patient monthly for standard refill processing and delivery.      Thank you,  Tamala Fothergill   [facility] Specialty Technician

## 2017-09-13 MED FILL — VIREAD/300MG/TAB: VIREAD/300MG/TAB | 30 days supply | Qty: 30 | Fill #3

## 2017-10-04 NOTE — Unmapped (Signed)
Specialty Pharmacy Refill Coordination Note     Steven Nelson is a 31 y.o. male contacted today regarding refills of his specialty medication(s).    Reviewed and verified with patien.t:     [address]  [place] [address]    Specialty medication(s) and dose(s) confirmed: yes  Changes to medications: no  Changes to insurance: no    Medication Adherence    Patient reported X missed doses in the last month:  0  Specialty Medication:  VIREAD         Refill Coordination    Has the Patients' Contact Information Changed:  No  Is the Shipping Address Different:  No       Shipping Information    Delivery Scheduled:  Yes  Delivery Date:  10/13/17  Medications to be Shipped:  [name]          Follow-up: 3 week(s)     Dorothea Ogle  Specialty Pharmacy Technician

## 2017-10-12 MED FILL — VIREAD/300MG/TAB: VIREAD/300MG/TAB | 30 days supply | Qty: 30 | Fill #4

## 2017-11-01 NOTE — Unmapped (Signed)
[facility] Specialty Pharmacy Refill Coordination Note  Specialty Medication(s): Viread 300mg   Additional Medications shipped: none    Steven Nelson, DOB: [birth year]  Phone: [phone] (home) , Alternate phone contact: N/A  Phone or address changes today?: No  All above HIPAA information was verified with patient.  Shipping Address: [address]  [name] [address]   Insurance changes? No    Completed refill call assessment today to schedule patient's medication shipment from the [facility] [phone]).      Confirmed the medication and dosage are correct and have not changed: Yes, regimen is correct and unchanged.    Confirmed patient started or stopped the following medications in the past month:  No, there are no changes reported at this time.    Are you tolerating your medication?:  Steven Nelson reports tolerating the medication.    ADHERENCE    Did you miss any doses in the past 4 weeks? No missed doses reported.    FINANCIAL/SHIPPING    Delivery Scheduled: Yes, Expected medication delivery date: 11/03/17     Steven Nelson did not have any additional questions at this time.    Delivery address validated in FSI scheduling system: Yes, address listed in FSI is correct.    We will follow up with patient monthly for standard refill processing and delivery.      Thank you,  Lupita Shutter   [facility] Specialty Pharmacist

## 2017-11-02 MED FILL — VIREAD/300MG/TAB: VIREAD/300MG/TAB | 30 days supply | Qty: 30 | Fill #5

## 2017-12-19 NOTE — Unmapped (Signed)
[facility] Specialty Pharmacy Refill and Clinical Coordination Note  Medication(s): Viread    Torsten Weniger, DOB: [birth year]  Phone: [phone] (home) , Alternate phone contact: N/A  Shipping address: [address]  [name] [address]  Phone or address changes today?: No  All above HIPAA information verified.  Insurance changes? No    Completed refill and clinical call assessment today to schedule patient's medication shipment from the [facility] [phone]).      MEDICATION RECONCILIATION    Confirmed the medication and dosage are correct and have not changed: Yes, regimen is correct and unchanged.    Were there any changes to your medication(s) in the past month:  No, there are no changes reported at this time.    ADHERENCE    Is this medicine transplant or covered by Medicare Part B? No.    Did you miss any doses in the past 4 weeks? No missed doses reported.  Adherence counseling provided? Not needed     SIDE EFFECT MANAGEMENT    Are you tolerating your medication?:  Trevone reports tolerating the medication.  Side effect management discussed: None      Therapy is appropriate and should be continued.    Evidence of clinical benefit: Patient not seen since last Clinical Follow-up      FINANCIAL/SHIPPING    Delivery Scheduled: Yes, Expected medication delivery date: Friday, 12/22/17   Additional medications refilled: No additional medications/refills needed at this time.    Lillie did not have any additional questions at this time.    Delivery address validated in FSI scheduling system: Yes, address listed above is correct.      We will follow up with patient monthly for standard refill processing and delivery.      Thank you,  Adron Bene   [facility] Shared [facility] Specialty Student Pharmacist

## 2017-12-20 MED FILL — VIREAD/300MG/TAB: VIREAD/300MG/TAB | 30 days supply | Qty: 30 | Fill #6

## 2018-01-11 NOTE — Unmapped (Signed)
[facility] Specialty Pharmacy Refill Coordination Note  Specialty Medication(s): Viread  Additional Medications shipped: na    Steven Nelson, DOB: 12-09-1985  Phone: [phone] (home) , Alternate phone contact: N/A  Phone or address changes today?: No  All above HIPAA information was verified with patient.  Shipping Address: [address]  [name] [address]   Insurance changes? No    Completed refill call assessment today to schedule patient's medication shipment from the [facility] [phone]).      Confirmed the medication and dosage are correct and have not changed: Yes, regimen is correct and unchanged.    Confirmed patient started or stopped the following medications in the past month:  No, there are no changes reported at this time.    Are you tolerating your medication?:  Steven Nelson reports tolerating the medication.    ADHERENCE    Did you miss any doses in the past 4 weeks? No missed doses reported.    FINANCIAL/SHIPPING    Delivery Scheduled: Yes, Expected medication delivery date: Wed, March 13     The patient will receive an FSI print out for each medication shipped and additional FDA Medication Guides as required.  Patient education from [place] or Robet Leu may also be included in the shipment    Steven Nelson did not have any additional questions at this time.    Delivery address validated in FSI scheduling system: Yes, address listed in FSI is correct.    We will follow up with patient monthly for standard refill processing and delivery.      Thank you,  Tawanna Solo Shared [facility] Specialty Pharmacist

## 2018-01-12 IMAGING — US US ABDOMEN LIMITED
1 series · 14 of 25 positions shown · non-contrast
Comparison: Abdominal ultrasound 03/19/2012

CLINICAL DATA: Abdominal pain for 4 days.

EXAM:
US ABDOMEN LIMITED - RIGHT UPPER QUADRANT

[Series 1: us abdomen limited · 0.11mm/px · 14 of 66 slices shown]
[im 1/66]
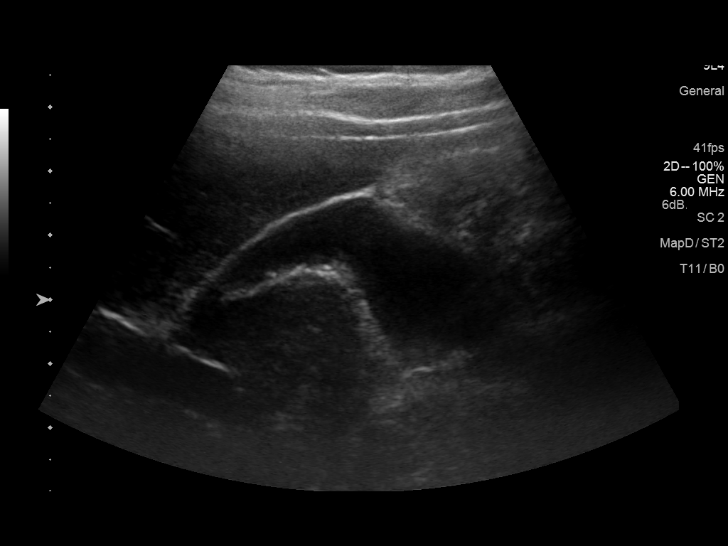
[im 6/66]
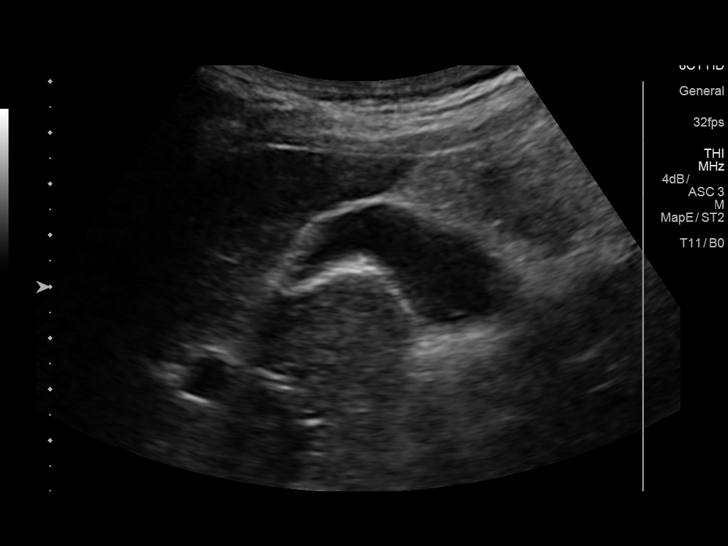
[im 11/66]
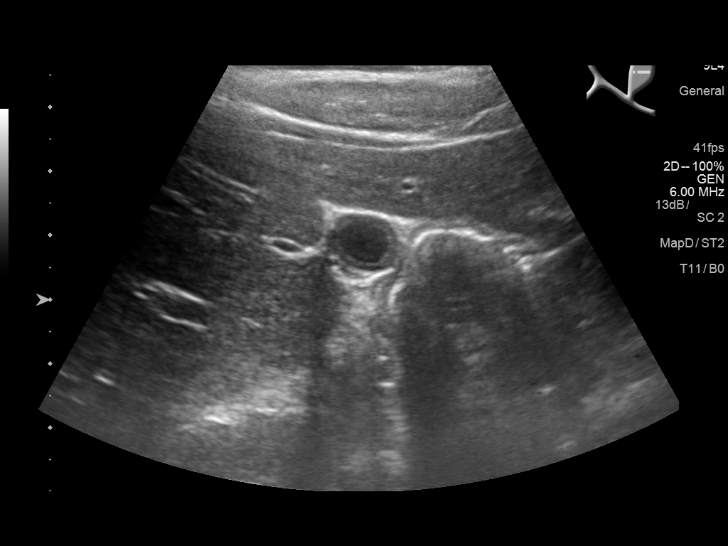
[im 17/66]
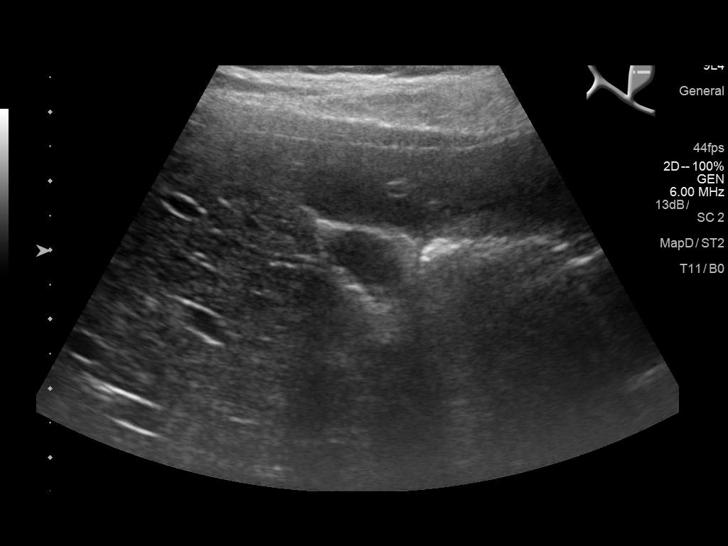
[im 22/66]
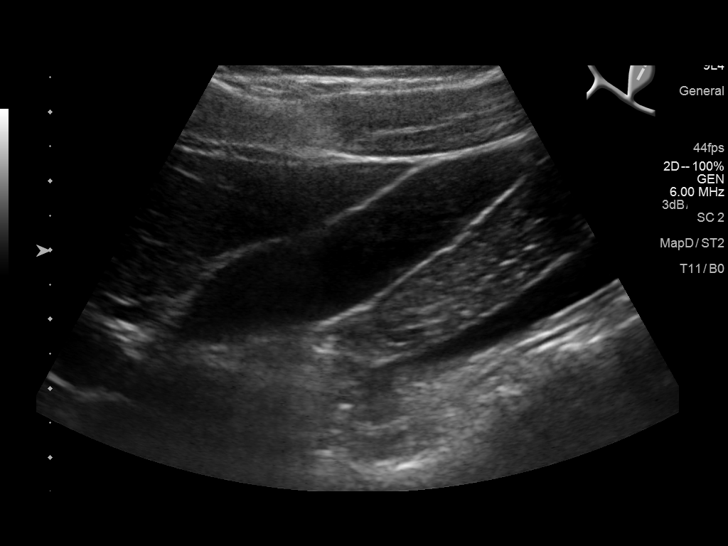
[im 25/66]
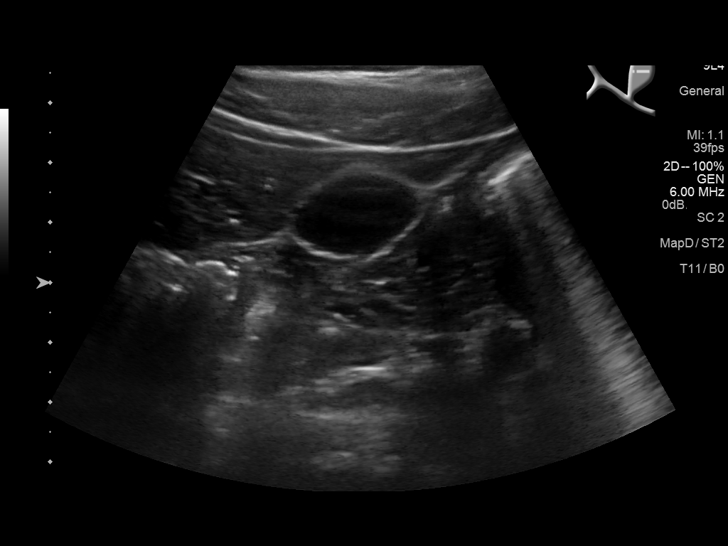
[im 30/66]
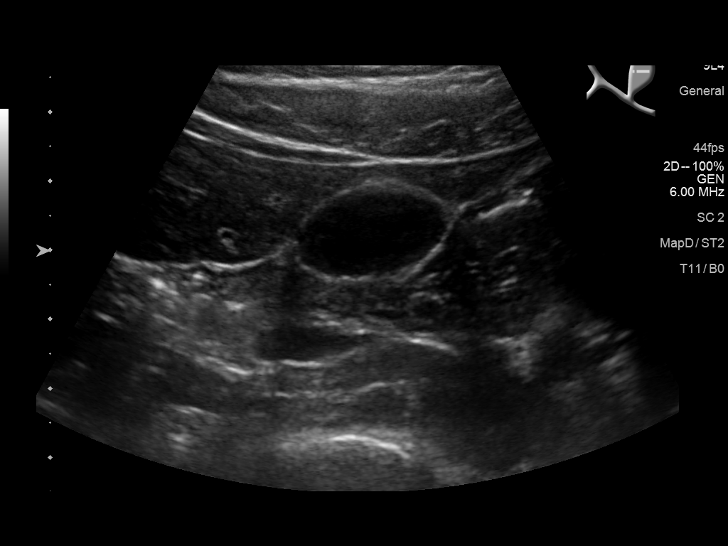
[im 36/66]
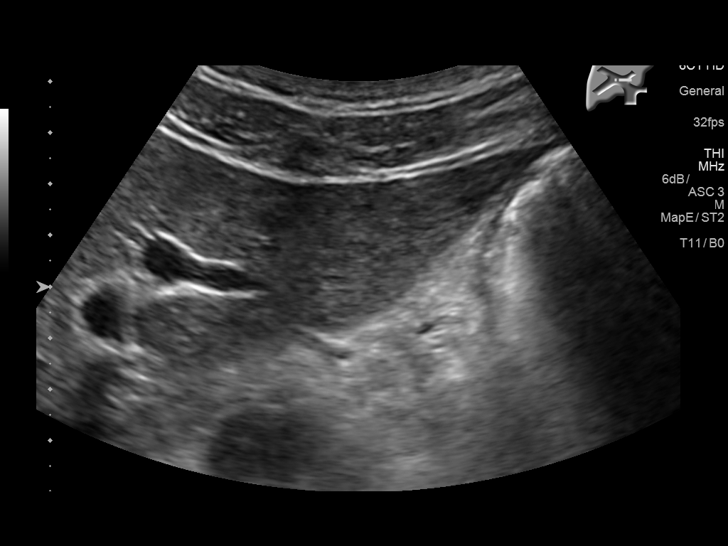
[im 41/66]
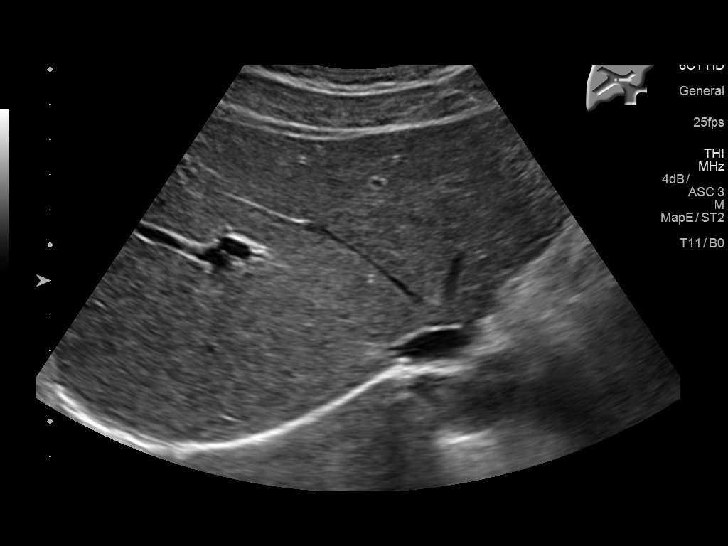
[im 44/66]
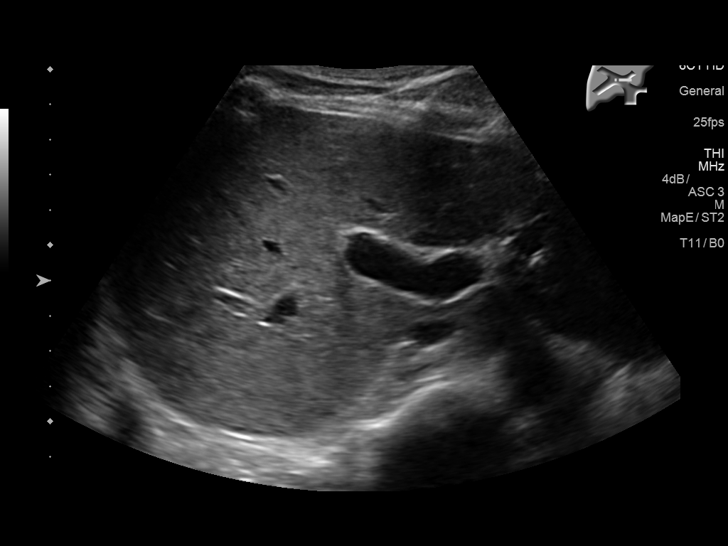
[im 49/66]
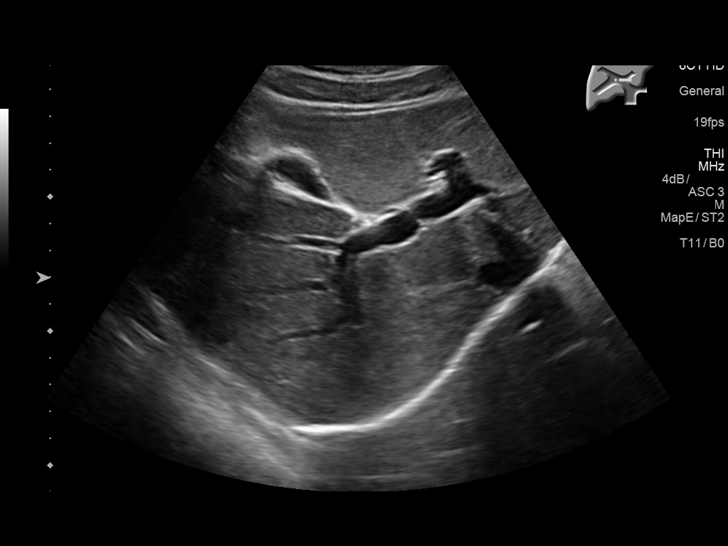
[im 55/66]
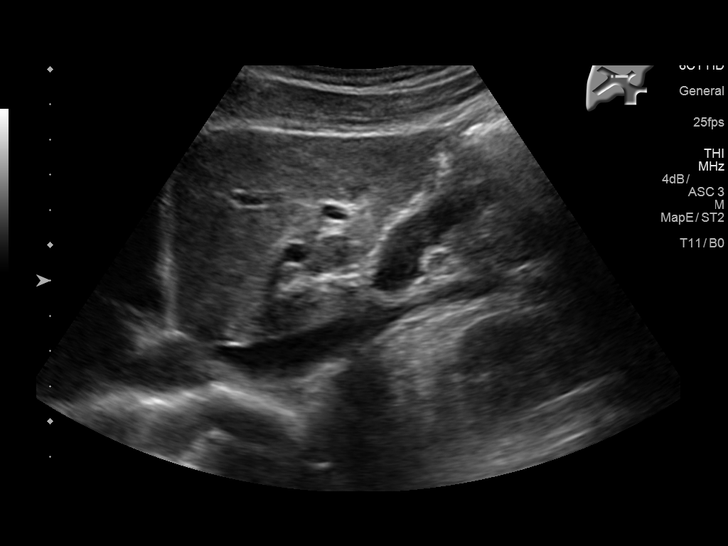
[im 60/66]
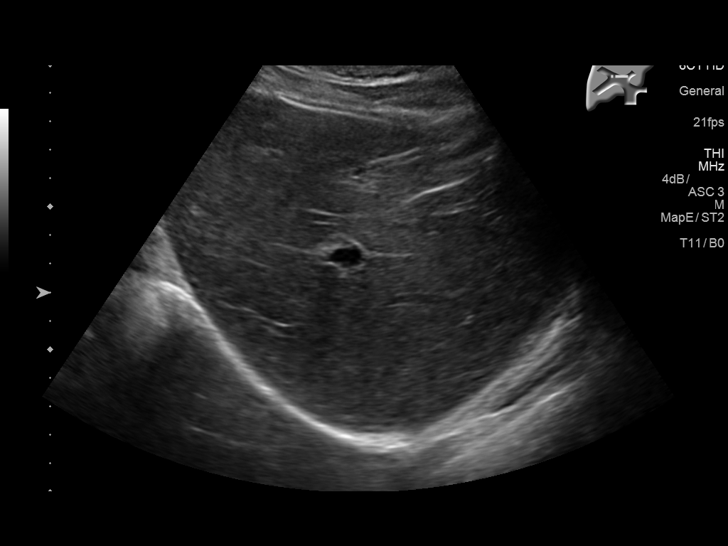
[im 66/66]
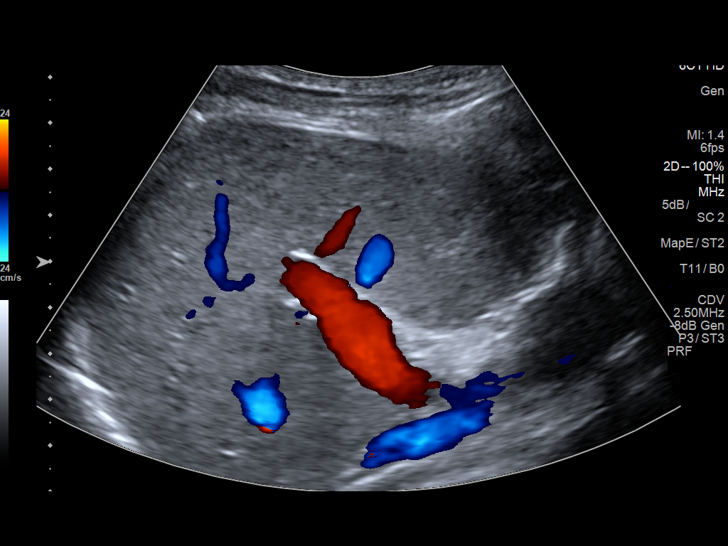

[14 of 25 positions shown; findings below may reference images not displayed]

FINDINGS: Gallbladder:

Elongated, unchanged from prior exam. No gallstones or wall
thickening visualized. No sonographic Murphy sign noted by
sonographer.

Common bile duct:

Diameter: 1-2 mm, normal.

Liver:

No focal lesion identified. Within normal limits in parenchymal
echogenicity. Normal directional flow in the main portal vein.
IMPRESSION: Normal right upper quadrant ultrasound.

## 2018-01-16 MED FILL — VIREAD/300MG/TAB: VIREAD/300MG/TAB | 30 days supply | Qty: 30 | Fill #7

## 2018-02-08 ENCOUNTER — Ambulatory Visit: Admit: 2018-02-08 | Discharge: 2018-02-09

## 2018-02-08 ENCOUNTER — Institutional Professional Consult (permissible substitution): Admit: 2018-02-08 | Discharge: 2018-02-09

## 2018-02-08 DIAGNOSIS — B181 Chronic viral hepatitis B without delta-agent: Principal | ICD-10-CM

## 2018-02-08 LAB — ALBUMIN
ALBUMIN: 4.8 g/dL (ref 3.5–5.0)
Albumin:MCnc:Pt:Ser/Plas:Qn:: 4.8

## 2018-02-08 LAB — CREATININE: EGFR MDRD AF AMER: >=60 mL/min/{1.73_m2} (ref >=60)

## 2018-02-08 LAB — CBC
HEMATOCRIT: 47.9 % (ref 41.0–53.0)
HEMOGLOBIN: 15.9 g/dL (ref 13.5–17.5)
MEAN CORPUSCULAR HEMOGLOBIN CONC: 33.2 g/dL (ref 31.0–37.0)
MEAN CORPUSCULAR HEMOGLOBIN: 25.5 pg — ABNORMAL LOW (ref 26.0–34.0)
MEAN CORPUSCULAR VOLUME: 76.7 fL — ABNORMAL LOW (ref 80.0–100.0)
MEAN PLATELET VOLUME: 8.9 fL (ref 7.0–10.0)
PLATELET COUNT: 170 10*9/L (ref 150–440)
RED CELL DISTRIBUTION WIDTH: 13.4 % (ref 12.0–15.0)
WBC ADJUSTED: 7.8 10*9/L (ref 4.5–11.0)

## 2018-02-08 LAB — GLUCOSE FASTING: Glucose^post CFst:MCnc:Pt:Ser/Plas:Qn:: 85

## 2018-02-08 LAB — ALT (SGPT): Alanine aminotransferase:CCnc:Pt:Ser/Plas:Qn:: 58

## 2018-02-08 LAB — ALKALINE PHOSPHATASE
ALKALINE PHOSPHATASE: 94 U/L (ref 38–126)
Alkaline phosphatase:CCnc:Pt:Ser/Plas:Qn:: 94

## 2018-02-08 LAB — PHOSPHORUS: Phosphate:MCnc:Pt:Ser/Plas:Qn:: 4

## 2018-02-08 LAB — BILIRUBIN TOTAL: Bilirubin:MCnc:Pt:Ser/Plas:Qn:: 0.5

## 2018-02-08 LAB — MEAN CORPUSCULAR HEMOGLOBIN CONC: Lab: 33.2

## 2018-02-08 LAB — BLOOD UREA NITROGEN: Urea nitrogen:MCnc:Pt:Ser/Plas:Qn:: 17

## 2018-02-08 LAB — AFP-TUMOR MARKER: Alpha-1-Fetoprotein.tumor marker:MCnc:Pt:Ser/Plas:Qn:: 1.69

## 2018-02-08 LAB — AST (SGOT): Aspartate aminotransferase:CCnc:Pt:Ser/Plas:Qn:: 37

## 2018-02-08 LAB — EGFR MDRD AF AMER: Glomerular filtration rate/1.73 sq M.predicted.black:ArVRat:Pt:Ser/Plas/Bld:Qn:Creatinine-based formula (MDRD): >=60

## 2018-02-08 NOTE — Unmapped (Unsigned)
[facility] LIVER C[facility]      [phone]      Name:Steven Nelson   UJWJ:[id number]   Age:32 y.o.   Sex:M   Race:Asian       CHIEF COMPLAINT: Study followup HBRN IA protocol.     HISTORY OF PRESENT ILLNESS: Steven Nelson is a 32 y.o.  [place] with chronic HBV originally HBeAg + disease. He is at week #360 of HBV cohort study, now on treatment. His first PEG injection 03/30/12 for immune active treatment trial, randomized PEG and tenofovir PEG reduced to 135 mcg for side effects including weight loss. He completed PEG at week 24. He has continued per protocol on tenofovir alone. He has persistently undetectable HBV DNA although his Hep Be Ag status fluctuates from positive to negative at various times.  He is scheduled for abdominal ultrasound with Doppler studies today.    Steven Nelson presents to clinic today alone. He continues to feel well and tolerating tenofovir without difficulty.  He states he has been 100% adherent to tenofovir since last visit.       PAST MEDICAL HISTORY:   1. Chronic hepatitis B, genotype C.   2. Dyslipidemia.   3. Lactose intolerance   4. 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   5. HCC surveillance: Hepatic U/S 03/01/16- ??Mildly heterogeneous echotexture of the liver without focal hepatic mass.  6. HAV immune  7. 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 He works in Set [employer] currently.      FAMILY HISTORY: There is no family history of hepatocellular carcinoma.     ALLERGIES: No known drug allergies.   CURRENT MEDICATIONS:   Tenofovir daily.       PHYSICAL EXAMINATION:   BP 129/78 (BP Site: L Arm, BP Position: Sitting, BP Cuff Size: Medium)  - Pulse 75  - Temp 36.5 ??C (97.7 ??F) (Oral)  - Resp 16  - Wt 52.5 kg (115 lb 12.8 oz)  - BMI 19.27 kg/m??     GENERAL: This is a thin [place] male in no acute distress.   Pleasant individual in NAD    HEENT: Sclera are anicteric, no temporal muscle loss, oropharynx is negative  NECK: No thyromegaly or lymphadenopathy, No carotid bruits  Chest: Clear to auscultation and percussion  Heart: S1, S2, RR, No murmurs  Abdomen: Soft, non-tender, non-distended, no hepatosplenomegaly, no masses appreciated, no ascites  Skin: No spider angiomata, No rashes  Extremities: Without pedal edema, no palmar erythema  Neuro: Grossly intact, No focal deficits      Labs:  Results for orders placed or performed in visit on 06/19/17   BUN   Result Value Ref Range    BUN 12 7 - 21 mg/dL   Cr - Creatinine   Result Value Ref Range    Creatinine 0.82 0.70 - 1.30 mg/dL    EGFR MDRD Af Amer >=13 >=60 mL/min/1.29m2    EGFR MDRD Non Af Amer >=60 >=60 mL/min/1.51m2   Glucose, Fasting   Result Value Ref Range    Glucose, Fasting 90 65 - 99 mg/dL   Alb - Albumin   Result Value Ref Range    Albumin 4.6 3.5 - 5.0 g/dL   AST   Result Value Ref Range    AST 33 19 - 55 U/L   ALT   Result Value Ref Range    ALT 45 19 - 72 U/L   Alkaline Phosphatase   Result Value Ref Range  Alkaline Phosphatase 83 38 - 126 U/L   T.Bili - Bilirubin, total   Result Value Ref Range    Total Bilirubin 0.3 0.0 - 1.2 mg/dL   CBC   Result Value Ref Range    WBC 4.9 4.5 - 11.0 10*9/L    RBC 6.09 (H) 4.50 - 5.90 10*12/L    HGB 15.8 13.5 - 17.5 g/dL    HCT 96.0 45.4 - 09.8 %    MCV 76.5 (L) 80.0 - 100.0 fL    MCH 25.9 (L) 26.0 - 34.0 pg    MCHC 33.8 31.0 - 37.0 g/dL    RDW 11.9 14.7 - 82.9 %    MPV 7.4 7.0 - 10.0 fL    Platelet 216 150 - 440 10*9/L   Insulin Level, Total   Result Value Ref Range    Insulin 4.5 3.0 - 25.0 mU/L   Lipid panel, Non-Fasting** (**Incl Chol, Trigly, HDL, LDL)   Result Value Ref Range    Triglycerides 44 1 - 149 mg/dL    Cholesterol 562 130 - 199 mg/dL    HDL 33 (L) 40 - 59 mg/dL    LDL Calculated 92 60 - 99 mg/dL    VLDL Cholesterol Cal 8.8 (L) 9 - 40 mg/dL    Chol/HDL Ratio 4.1 <8.6    Non-HDL Cholesterol 101 mg/dL    FASTING Yes    Hepatitis B e Antigen   Result Value Ref Range    Hep B E Ag Negative Negative   Hepatitis B e Antibody   Result Value Ref Range    Hep B E Ab Negative Negative   Hepatitis B DNA, Quantitative, PCR   Result Value Ref Range    HBV DNA Quant Not Detected Not Detected    HBV DNA Comment     AFP tumor marker   Result Value Ref Range    AFP-Tumor Marker 1.43 <7.51 ng/mL         ASSESSMENT: Patient is a 32 y.o. Asian male  Week# 360 of HBV Cohort trial. Completed PEG and continues on tenofovir. He has persistently undetectable HBV DNA although his Hep Be Ag status fluctuates from positive to negative at various times.    Plan:  Continue tenofovir  Labs per protocol  F/U per protocol  [facility] screening- scheduled today    Tina Griffiths, PA-C  [facility]  [address] [place], [place] [address]  [place], [place] [address]  Ph: [phone]  Fax: [phone]

## 2018-02-10 LAB — HEPATITIS BE ANTIBODY: Hepatitis B virus little e Ab.IgG:PrThr:Pt:Ser:Ord:IA: NEGATIVE

## 2018-02-10 LAB — HEPATITIS BE ANTIGEN: Hepatitis B virus little e Ag:PrThr:Pt:Ser/Plas:Ord:IA: NEGATIVE

## 2018-02-14 LAB — HBV DNA QUANT: Hepatitis B virus DNA:PrThr:Pt:Bld:Ord:Probe.amp.tar: NOT DETECTED

## 2018-02-27 NOTE — Unmapped (Signed)
[facility] Specialty Pharmacy Refill Coordination Note  Specialty Medication(s): Steven Nelson, DOB: 1986/06/08  Phone: [phone] (home) , Alternate phone contact: N/A  Phone or address changes today?: No  All above HIPAA information was verified with patient.  Shipping Address: [address]  [name] [address]   Insurance changes? No    Completed refill call assessment today to schedule patient's medication shipment from the [facility] [phone]).      Confirmed the medication and dosage are correct and have not changed: Yes, regimen is correct and unchanged.    Confirmed patient started or stopped the following medications in the past month:  No, there are no changes reported at this time.    Are you tolerating your medication?:  Steven Nelson reports tolerating the medication.    ADHERENCE    (Below is required for Medicare Part B or Transplant patients only - per drug):   How many tablets were dispensed last month: 30  Patient currently has 5 remaining.    Did you miss any doses in the past 4 weeks? No missed doses reported.    FINANCIAL/SHIPPING    Delivery Scheduled: Yes, Expected medication delivery date: 4/25     The patient will receive an FSI print out for each medication shipped and additional FDA Medication Guides as required.  Patient education from [place] or Robet Leu may also be included in the shipment    Steven Nelson did not have any additional questions at this time.    Delivery address validated in FSI scheduling system: Yes, address listed in FSI is correct.    We will follow up with patient monthly for standard refill processing and delivery.      Thank you,  [name]   [facility] Shared [facility] Specialty Technician

## 2018-02-28 MED FILL — VIREAD/300MG/TAB: VIREAD/300MG/TAB | 30 days supply | Qty: 30 | Fill #8

## 2018-03-20 NOTE — Unmapped (Signed)
[facility] Specialty Pharmacy Refill Coordination Note    Specialty Medication(s) to be Shipped:   Infectious Disease: VIREAD 300MG     Other medication(s) to be shipped:       Steven Nelson, DOB: [birth year]  Phone: [phone] (home)   Shipping Address: [address]  [place] [address]    All above HIPAA information was verified with patient.     Completed refill call assessment today to schedule patient's medication Nelson from the [facility] [phone]).       Specialty medication(s) and dose(s) confirmed: Regimen is correct and unchanged.   Changes to medications: Steven Nelson reports no changes reported at this time.  Changes to insurance: No  Questions for the pharmacist: No    The patient will receive an FSI print out for each medication shipped and additional FDA Medication Guides as required.  Patient education from [place] or Steven Nelson.    DISEASE-SPECIFIC INFORMATION        N/A    ADHERENCE              MEDICARE PART B DOCUMENTATION         SHIPPING     Shipping address confirmed in FSI.     Delivery Scheduled: Yes, Expected medication delivery date: 130865 via UPS or courier.     Steven Nelson   [facility] Shared [facility] Specialty Technician

## 2018-03-29 MED ORDER — ENTECAVIR 0.5 MG TABLET
ORAL | 0 refills | 0 days
Start: 2018-03-29 — End: 2019-03-29

## 2018-03-29 MED ORDER — TENOFOVIR DISOPROXIL FUMARATE 300 MG TABLET
ORAL_TABLET | ORAL | 1 refills | 0 days
Start: 2018-03-29 — End: 2019-03-29

## 2018-03-29 MED ORDER — TENOFOVIR ALAFENAMIDE 25 MG TABLET
ORAL | 0 refills | 0 days
Start: 2018-03-29 — End: 2019-03-29

## 2018-03-30 MED ORDER — TENOFOVIR ALAFENAMIDE 25 MG TABLET: 25 mg | each | 5 refills | 0 days

## 2018-03-30 MED ORDER — TENOFOVIR ALAFENAMIDE 25 MG TABLET
Freq: Every day | ORAL | 5 refills | 0.00000 days | Status: CP
Start: 2018-03-30 — End: 2018-10-03
  Filled 2018-06-17: qty 30, 30d supply, fill #0

## 2018-03-30 NOTE — Unmapped (Signed)
Patient has been receiving Viread with manufacturer assistance. However, manufacturer no longer providing assistance for Viread as there's generic available. Per Dr. Foy Guadalajara, switch to [facility] but generic TDF also ok if more cost effective for patient. Sent Vemlidy rx to [facility].

## 2018-04-03 MED FILL — TENOFOVIR DISOPROXIL FUMA/300MG/TABS: TENOFOVIR DISOPROXIL FUMA/300MG/TABS | 14 days supply | Qty: 14 | Fill #0

## 2018-04-03 NOTE — Unmapped (Signed)
Patient's medication not delivered as previously set up. Unfortunately, Viread assistance has ended. Plan is to get pt approved for [facility] mfg assistance. Not approved yet, so patient opted to get 14 days on generic TDF on his insurance ($80.44), and will put this amount on his account with option to pay $10/month.     [facility] Specialty Pharmacy Refill Coordination Note  Specialty Medication(s): tenofovir (tdf) 300 mg - switching to generic  Additional Medications shipped: na    Pavlos Yon, DOB: 1986-10-24  Phone: [phone] (home) , Alternate phone contact: N/A  Phone or address changes today?: No  All above HIPAA information was verified with patient.  Shipping Address: [address]  [name] [address]   Insurance changes? No    Completed refill call assessment today to schedule patient's medication shipment from the [facility] [phone]).      Confirmed the medication and dosage are correct and have not changed: Yes, regimen is correct and unchanged.    Confirmed patient started or stopped the following medications in the past month:  No, there are no changes reported at this time.    Are you tolerating your medication?:  Taejon reports tolerating the medication.    ADHERENCE    Did you miss any doses in the past 4 weeks? No missed doses reported.    FINANCIAL/SHIPPING    Delivery Scheduled: Yes, Expected medication delivery date: Wed, May 29     The patient will receive an FSI print out for each medication shipped and additional FDA Medication Guides as required.  Patient education from [place] or Robet Leu may also be included in the shipment    [place] did not have any additional questions at this time.    Delivery address validated in FSI scheduling system: Yes, address listed in FSI is correct.    We will follow up with patient monthly for standard refill processing and delivery.      Thank you,  Tawanna Solo Shared [facility] Specialty Pharmacist

## 2018-04-12 NOTE — Unmapped (Signed)
[facility] Specialty Medication Referral: PA APPROVED    Medication (Brand/Generic): Vemlidy 25mg     Initial FSI Test Claim completed with resulted information below:  No PA required  Patient ABLE to fill at [facility]:  [id number]  Anticipated Copay: $0.00  Is anticipated copay with a copay card or grant? Yes    As Co-pay is under $100 defined limit, per policy there will be no further investigation of need for financial assistance at this time unless patient requests. This referral has been communicated to the provider and handed off to the [facility] team for further processing and filling of prescribed medication.   ______________________________________________________________________  Please utilize this referral for viewing purposes as it will serve as the central location for all relevant documentation and updates.

## 2018-04-13 MED FILL — VEMLIDY/25MG/TABS: VEMLIDY/25MG/TABS | 30 days supply | Qty: 30 | Fill #0

## 2018-04-13 NOTE — Unmapped (Signed)
[facility]   Patient Onboarding/Medication Counseling    Steven Nelson is a 32 y.o. male with chronic Hep B who I am counseling today on initiation of therapy.    Medication: Vemlidy 25 mg    Verified patient's date of birth / HIPAA.      Education Provided: ??    Dose/Administration discussed: 1 tablet by mouth once daily. This medication should be taken  with food.  Stressed the importance of taking medication as prescribed and to contact provider if that changes at any time.  Discussed missed dose instructions.    Storage requirements: this medicine should be stored at room temperature.     Side effects / precautions discussed: Discussed common side effects, including headache, fatigue. If patient experiences gi pain or jaundice they need to call the doctor.  Patient will receive a drug information handout with shipment.    Handling precautions / disposal reviewed:  n/a.    Drug Interactions: other medications reviewed and up to date in Epic.  No drug interactions identified.    Comorbidities/Allergies: reviewed and up to date in Epic.    Verified therapy is appropriate and should continue      Delivery Information    Medication Assistance provided: [facility] Assistance    Anticipated copay of $0 reviewed with patient. Verified delivery address in FSI and reviewed medication storage requirement.    Scheduled delivery date: Monday, June 10    Explained that we ship using UPS or courier and this shipment will not require a signature.      Explained the services we provide at [facility] and that each month we would call to set up refills.  Stressed importance of returning phone calls so that we could ensure they receive their medications in time each month.  Informed patient that we should be setting up refills 7-10 days prior to when they will run out of medication.  Informed patient that welcome packet will be sent.      Patient verbalized understanding of the above information as well as how to contact the pharmacy at [phone] option 4 with any questions/concerns.  The pharmacy is open Monday through Friday 8:30am-4:30pm.  A pharmacist is available 24/7 via pager to answer any clinical questions they may have.        Patient Specific Needs      ? Patient has no physical, cognitive, or cultural barriers.    ? Patient prefers to have medications discussed with  Patient     ? Patient is able to read and understand education materials at a high school level or above.    ? Patient's primary language is  English           Presenter, broadcasting  [facility] Specialty Pharmacist

## 2018-05-18 NOTE — Unmapped (Signed)
[facility] Refill Coordination Note    Specialty Medication(s) to be Shipped:   Infectious Disease: Vemlidy 25mg      Steven Nelson, DOB: [birth year]  Phone: [phone] (home)   Shipping Address: [address]  [place] [address]    All above HIPAA information was verified with patient.     Completed refill call assessment today to schedule patient's medication shipment from the [facility] [phone]).       Specialty medication(s) and dose(s) confirmed: Regimen is correct and unchanged.   Changes to medications: Steven Nelson reports no changes reported at this time.  Changes to insurance: No  Questions for the pharmacist: No    The patient will receive an FSI print out for each medication shipped and additional FDA Medication Guides as required.  Patient education from [place] or Robet Leu may also be included in the shipment.    DISEASE-SPECIFIC INFORMATION        N/A    ADHERENCE     Medication Adherence    Patient reported X missed doses in the last month:  0         MEDICARE PART B DOCUMENTATION     Not Applicable    SHIPPING     Shipping address confirmed in FSI.     Delivery Scheduled: Yes, Expected medication delivery date: 05/22/2018 via UPS or courier.     Steven Nelson Steven Nelson   [facility] Shared [facility] Specialty Technician

## 2018-05-21 MED FILL — VEMLIDY/25MG/TABS: VEMLIDY/25MG/TABS | 30 days supply | Qty: 30 | Fill #1

## 2018-05-21 NOTE — Unmapped (Signed)
[facility] Refill and Clinical Coordination Note  Medication(s): Vemlidy 25mg     Nathanyal Ashmead, DOB: August 08, 1986  Phone: [phone] (home) , Alternate phone contact: N/A  Shipping address: [address]  [name] [address]  Phone or address changes today?: No  All above HIPAA information verified.  Insurance changes? No    Completed refill and clinical call assessment today to schedule patient's medication shipment from the [facility] [phone]).      MEDICATION RECONCILIATION    Confirmed the medication and dosage are correct and have not changed: Yes, regimen is correct and unchanged.    Were there any changes to your medication(s) in the past month:  No, there are no changes reported at this time.    ADHERENCE    Is this medicine transplant or covered by Medicare Part B? No.    Did you miss any doses in the past 4 weeks? No missed doses reported.  Adherence counseling provided? Not needed     SIDE EFFECT MANAGEMENT    Are you tolerating your medication?:  Chasten reports tolerating the medication.  Side effect management discussed: None      Therapy is appropriate and should be continued.    Evidence of clinical benefit: See Epic note from 03/30/18      FINANCIAL/SHIPPING    Delivery Scheduled: Yes, Expected medication delivery date: 05/22/18   Additional medications refilled: No additional medications/refills needed at this time.    The patient will receive an FSI print out for each medication shipped and additional FDA Medication Guides as required.  Patient education from [place] or Robet Leu may also be included in the shipment.    [name] did not have any additional questions at this time.    Delivery address validated in FSI scheduling system: Yes, address listed above is correct.      We will follow up with patient monthly for standard refill processing and delivery.      Thank you,  Lupita Shutter   [facility] Specialty Pharmacist

## 2018-06-15 NOTE — Unmapped (Signed)
[facility] Refill Coordination Note    Specialty Medication(s) to be Shipped:   Infectious Disease: Vemlidy    Other medication(s) to be shipped:       Edwing Figley, DOB: 1986-10-10  Phone: [phone] (home)   Shipping Address: [address]  [name] [address]    All above HIPAA information was verified with patient.     Completed refill call assessment today to schedule patient's medication shipment from the [facility] [phone]).       Specialty medication(s) and dose(s) confirmed: Regimen is correct and unchanged.   Changes to medications: Norvil reports no changes reported at this time.  Changes to insurance: No  Questions for the pharmacist: No    The patient will receive an FSI print out for each medication shipped and additional FDA Medication Guides as required.  Patient education from [place] or Robet Leu may also be included in the shipment.    DISEASE/MEDICATION-SPECIFIC INFORMATION        N/A    ADHERENCE              MEDICARE PART B DOCUMENTATION         SHIPPING     Shipping address confirmed in FSI.     Delivery Scheduled: Yes, Expected medication delivery date: 081219 via UPS or courier.     Antonietta Barcelona   [facility] Shared [facility] Specialty Technician

## 2018-06-17 MED FILL — VEMLIDY 25 MG TABLET: 30 days supply | Qty: 30 | Fill #0 | Status: AC

## 2018-07-06 NOTE — Unmapped (Signed)
[facility] Refill Coordination Note    Specialty Medication(s) to be Shipped:   Infectious Disease: Vemlidy    Other medication(s) to be shipped:       Steven Nelson, DOB: 06-07-1986  Phone: [phone] (home)   Shipping Address: [address]  [name] [address]    All above HIPAA information was verified with patient.     Completed refill call assessment today to schedule patient's medication shipment from the [facility] [phone]).       Specialty medication(s) and dose(s) confirmed: Regimen is correct and unchanged.   Changes to medications: [name] reports no changes reported at this time.  Changes to insurance: No  Questions for the pharmacist: No    The patient will receive a drug information handout for each medication shipped and additional FDA Medication Guides as required.      DISEASE/MEDICATION-SPECIFIC INFORMATION        N/A    ADHERENCE              MEDICARE PART B DOCUMENTATION         SHIPPING     Shipping address confirmed in Epic.     Delivery Scheduled: Yes, Expected medication delivery date: 090619 via UPS or courier.     Steven Nelson   [facility] Specialty Technician

## 2018-07-12 MED FILL — VEMLIDY 25 MG TABLET: 30 days supply | Qty: 30 | Fill #1

## 2018-07-12 MED FILL — VEMLIDY 25 MG TABLET: 30 days supply | Qty: 30 | Fill #1 | Status: AC

## 2018-07-24 ENCOUNTER — Institutional Professional Consult (permissible substitution): Admit: 2018-07-24 | Discharge: 2018-07-25 | Payer: BLUE CROSS/BLUE SHIELD

## 2018-07-24 DIAGNOSIS — B181 Chronic viral hepatitis B without delta-agent: Secondary | ICD-10-CM

## 2018-07-24 DIAGNOSIS — O98419 Viral hepatitis complicating pregnancy, unspecified trimester: Principal | ICD-10-CM

## 2018-07-24 LAB — AST (SGOT): Aspartate aminotransferase:CCnc:Pt:Ser/Plas:Qn:: 21

## 2018-07-24 LAB — CBC
HEMATOCRIT: 46.4 % (ref 41.0–53.0)
MEAN CORPUSCULAR HEMOGLOBIN CONC: 34.3 g/dL (ref 31.0–37.0)
MEAN CORPUSCULAR VOLUME: 77.7 fL — ABNORMAL LOW (ref 80.0–100.0)
MEAN PLATELET VOLUME: 8.3 fL (ref 7.0–10.0)
PLATELET COUNT: 200 10*9/L (ref 150–440)
RED BLOOD CELL COUNT: 5.97 10*12/L — ABNORMAL HIGH (ref 4.50–5.90)
RED CELL DISTRIBUTION WIDTH: 13.8 % (ref 12.0–15.0)
WBC ADJUSTED: 4.5 10*9/L (ref 4.5–11.0)

## 2018-07-24 LAB — CREATININE
CREATININE: 0.84 mg/dL (ref 0.70–1.30)
EGFR CKD-EPI AA MALE: >90 mL/min/{1.73_m2} (ref >=60)

## 2018-07-24 LAB — AFP-TUMOR MARKER: Alpha-1-Fetoprotein.tumor marker:MCnc:Pt:Ser/Plas:Qn:: 1

## 2018-07-24 LAB — EGFR CKD-EPI AA MALE: Lab: >90

## 2018-07-24 LAB — ALT (SGPT): Alanine aminotransferase:CCnc:Pt:Ser/Plas:Qn:: 15 — ABNORMAL LOW

## 2018-07-24 LAB — PHOSPHORUS: Phosphate:MCnc:Pt:Ser/Plas:Qn:: 2.4 — ABNORMAL LOW

## 2018-07-24 LAB — ALKALINE PHOSPHATASE: Alkaline phosphatase:CCnc:Pt:Ser/Plas:Qn:: 61

## 2018-07-24 LAB — AFP TUMOR MARKER: AFP-TUMOR MARKER: 1 ng/mL (ref <8)

## 2018-07-24 LAB — LIPID PANEL
CHOLESTEROL/HDL RATIO SCREEN: 6.5 — ABNORMAL HIGH (ref <5.0)
CHOLESTEROL: 162 mg/dL (ref 100–199)
HDL CHOLESTEROL: 25 mg/dL — ABNORMAL LOW (ref 40–59)
NON-HDL CHOLESTEROL: 137 mg/dL
TRIGLYCERIDES: 82 mg/dL (ref 1–149)

## 2018-07-24 LAB — CHOLESTEROL/HDL RATIO SCREEN: Lab: 6.5 — ABNORMAL HIGH

## 2018-07-24 LAB — MEAN PLATELET VOLUME: Lab: 8.3

## 2018-07-24 LAB — ALBUMIN: Albumin:MCnc:Pt:Ser/Plas:Qn:: 4.3

## 2018-07-24 LAB — GLUCOSE, FASTING: GLUCOSE FASTING: 77 mg/dL (ref 65–99)

## 2018-07-24 LAB — BLOOD UREA NITROGEN: Urea nitrogen:MCnc:Pt:Ser/Plas:Qn:: 14

## 2018-07-24 LAB — GLUCOSE FASTING: Glucose^post CFst:MCnc:Pt:Ser/Plas:Qn:: 77

## 2018-07-24 LAB — BILIRUBIN TOTAL: Bilirubin:MCnc:Pt:Ser/Plas:Qn:: 0.4

## 2018-07-24 NOTE — Unmapped (Signed)
[facility]      [id number]      Name:Steven Nelson   LOVF:[id number]   Age:32 y.o.   Sex:M   Race:Asian       CHIEF COMPLAINT: Study followup HBRN IA protocol.     HISTORY OF PRESENT ILLNESS: Steven Nelson is a 32 y.o.  [place] with chronic HBV originally HBeAg + disease. He is at week #360 of HBV cohort study, now on treatment. His first PEG injection 03/30/12 for immune active treatment trial, randomized PEG and tenofovir PEG reduced to 135 mcg for side effects including weight loss. He completed PEG at week 24. He has continued per protocol on tenofovir alone. He has persistently undetectable HBV DNA although his Hep Be Ag status fluctuates from positive to negative at various times.  He is scheduled for abdominal ultrasound with Doppler studies today.    Steven Nelson presents to clinic today alone. He continues to feel well and tolerating tenofovir without difficulty.  He states he has been 100% adherent to tenofovir since last visit.       PAST MEDICAL HISTORY:   1. Chronic hepatitis B, genotype C.   2. Dyslipidemia.   3. Lactose intolerance   4. 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   5. HCC surveillance: Hepatic U/S 4/19- ??Mildly heterogeneous echotexture of the liver without focal hepatic mass.  6. HAV immune  7. 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 He works in Set [employer] currently.      FAMILY HISTORY: There is no family history of hepatocellular carcinoma.     ALLERGIES: No known drug allergies.   CURRENT MEDICATIONS:   Tenofovir daily.       PHYSICAL EXAMINATION:   BP 130/72 (BP Site: R Arm, BP Position: Sitting, BP Cuff Size: Medium)  - Pulse 71  - Temp 36.6 ??C (97.9 ??F) (Oral)  - Resp 12  - Wt 55.3 kg (121 lb 14.4 oz)  - BMI 20.29 kg/m??     GENERAL: This is a thin [place] male in no acute distress.   Pleasant individual in NAD    HEENT: Sclera are anicteric, no temporal muscle loss, oropharynx is negative  NECK: No thyromegaly or lymphadenopathy, No carotid bruits  Chest: Clear to auscultation and percussion  Heart: S1, S2, RR, No murmurs  Abdomen: Soft, non-tender, non-distended, no hepatosplenomegaly, no masses appreciated, no ascites  Skin: No spider angiomata, No rashes  Extremities: Without pedal edema, no palmar erythema  Neuro: Grossly intact, No focal deficits      Labs:  Results for orders placed or performed in visit on 07/24/18   Phosphorus Level   Result Value Ref Range    Phosphorus 2.4 (L) 2.9 - 4.7 mg/dL   AFP TUMOR MARKER   Result Value Ref Range    AFP-Tumor Marker 1 <8 ng/mL   Lipid Panel   Result Value Ref Range    Triglycerides 82 1 - 149 mg/dL    Cholesterol 166 063 - 199 mg/dL    HDL 25 (L) 40 - 59 mg/dL    LDL Calculated 016 (H) 60 - 99 mg/dL    VLDL Cholesterol Cal 16.4 10 - 50 mg/dL    Chol/HDL Ratio 6.5 (H) <5.0    Non-HDL Cholesterol 137 mg/dL    FASTING No    CBC   Result Value Ref Range    WBC 4.5 4.5 - 11.0 10*9/L    RBC 5.97 (H) 4.50 -  5.90 10*12/L    HGB 15.9 13.5 - 17.5 g/dL    HCT 16.1 09.6 - 04.5 %    MCV 77.7 (L) 80.0 - 100.0 fL    MCH 26.6 26.0 - 34.0 pg    MCHC 34.3 31.0 - 37.0 g/dL    RDW 40.9 81.1 - 91.4 %    MPV 8.3 7.0 - 10.0 fL    Platelet 200 150 - 440 10*9/L   Bilirubin, total   Result Value Ref Range    Total Bilirubin 0.4 0.0 - 1.2 mg/dL   Alkaline Phosphatase   Result Value Ref Range    Alkaline Phosphatase 61 38 - 126 U/L   ALT   Result Value Ref Range    ALT 15 (L) 19 - 72 U/L   AST   Result Value Ref Range    AST 21 19 - 55 U/L   Albumin   Result Value Ref Range    Albumin 4.3 3.5 - 5.0 g/dL   Glucose, fasting   Result Value Ref Range    Glucose, Fasting 77 65 - 99 mg/dL   Creatinine   Result Value Ref Range    Creatinine 0.84 0.70 - 1.30 mg/dL    EGFR CKD-EPI Non-African American, Male >90 >=60 mL/min/1.16m2    EGFR CKD-EPI African American, Male >90 >=60 mL/min/1.19m2   BUN   Result Value Ref Range    BUN 14 7 - 21 mg/dL         ASSESSMENT: Patient is a 32 y.o. Asian male  Week# 384 of HBV Cohort trial. Completed PEG and continues on tenofovir. He has persistently undetectable HBV DNA although his Hep Be Ag status fluctuates from positive to negative at various times.    Plan:  Continue tenofovir  Labs per protocol  F/U per protocol  HCC screening- scheduled today    Earley Abide, NP-C  [facility]  [address] [place], [place] [address]  [place], [place] [address]  Ph: [phone]  Fax: [phone]

## 2018-07-25 LAB — INSULIN LEVEL: Chemistry studies:Cmplx:-:^Patient:Set:: 8.4

## 2018-07-26 LAB — HEPATITIS BE ANTIBODY: Hepatitis B virus little e Ab.IgG:PrThr:Pt:Ser:Ord:IA: NEGATIVE

## 2018-07-26 LAB — HEPATITIS BE ANTIGEN: Hepatitis B virus little e Ag:PrThr:Pt:Ser/Plas:Ord:IA: NEGATIVE

## 2018-07-27 LAB — HBV DNA QUANT: Hepatitis B virus DNA:PrThr:Pt:Bld:Ord:Probe.amp.tar: NOT DETECTED

## 2018-07-27 LAB — HEPATITIS B DNA, ULTRAQUANTITATIVE, PCR

## 2018-08-03 ENCOUNTER — Other Ambulatory Visit: Payer: Self-pay | Admitting: Gastroenterology

## 2018-08-03 DIAGNOSIS — B181 Chronic viral hepatitis B without delta-agent: Secondary | ICD-10-CM

## 2018-08-06 NOTE — Unmapped (Signed)
[facility] Refill Coordination Note  Specialty Medication(s): Wynonia Sours  Additional Medications shipped:      Steven Nelson, DOB: 02-09-1986  Phone: [phone] (home) , Alternate phone contact: N/A  Phone or address changes today?: No  All above HIPAA information was verified with patient.  Shipping Address: [address]  [name] [address]   Insurance changes? No    Completed refill call assessment today to schedule patient's medication shipment from the [facility] [phone]).      Confirmed the medication and dosage are correct and have not changed: Yes, regimen is correct and unchanged.    Confirmed patient started or stopped the following medications in the past month:  Yes. Steven Nelson reports starting the following medications: protein    Are you tolerating your medication?:  Steven Nelson reports tolerating the medication.    ADHERENCE    (Below is required for Medicare Part B or Transplant patients only - per drug):   How many tablets were dispensed last month: 30 tabs  Patient currently has 7 tabs remaining.    Did you miss any doses in the past 4 weeks? No missed doses reported.    FINANCIAL/SHIPPING    Delivery Scheduled: Yes, Expected medication delivery date: 100319     The patient will receive a drug information handout for each medication shipped and additional FDA Medication Guides as required.      Steven Nelson did not have any additional questions at this time.    Delivery address validated in Epic.    We will follow up with patient monthly for standard refill processing and delivery.      Thank you,  Antonietta Barcelona   [facility] Specialty Technician

## 2018-08-08 MED FILL — VEMLIDY 25 MG TABLET: 30 days supply | Qty: 30 | Fill #2

## 2018-08-08 MED FILL — VEMLIDY 25 MG TABLET: 30 days supply | Qty: 30 | Fill #2 | Status: AC

## 2018-08-08 NOTE — Unmapped (Signed)
Lab orders entered

## 2018-08-10 LAB — PHOSPHORUS, SERUM: Lab: 2.9

## 2018-08-14 ENCOUNTER — Ambulatory Visit
Admission: RE | Admit: 2018-08-14 | Discharge: 2018-08-14 | Disposition: A | Discharge status: Home / Self Care | Payer: No Typology Code available for payment source | Source: Ambulatory Visit | Attending: Gastroenterology | Admitting: Gastroenterology

## 2018-08-14 DIAGNOSIS — B181 Chronic viral hepatitis B without delta-agent: Secondary | ICD-10-CM

## 2018-08-28 NOTE — Unmapped (Signed)
[facility] Pharmacy Refill Coordination Note  Specialty Medication(s): Vemlidy 25mg   Additional Medications shipped:      Steven Nelson, DOB: 1986-07-11  Phone: [phone] (home) , Alternate phone contact: N/A  Phone or address changes today?: No  All above HIPAA information was verified with patient.  Shipping Address: [address]  [name] [address]   Insurance changes? No    Completed refill call assessment today to schedule patient's medication shipment from the [facility] [phone]).      Confirmed the medication and dosage are correct and have not changed: Yes, regimen is correct and unchanged.    Confirmed patient started or stopped the following medications in the past month:  No, there are no changes reported at this time.    Are you tolerating your medication?:  Steven Nelson reports tolerating the medication.    ADHERENCE    (Below is required for Medicare Part B or Transplant patients only - per drug):   How many tablets were dispensed last month: 30 tabs  Patient currently has 14 tabs remaining.    Did you miss any doses in the past 4 weeks? No missed doses reported.    FINANCIAL/SHIPPING    Delivery Scheduled: Yes, Expected medication delivery date: 110119     Medication will be delivered via UPS to the home address in [facility].    The patient will receive a drug information handout for each medication shipped and additional FDA Medication Guides as required.      Steven Nelson did not have any additional questions at this time.    We will follow up with patient monthly for standard refill processing and delivery.      Thank you,  Antonietta Barcelona   [facility] Specialty Technician

## 2018-09-06 MED FILL — VEMLIDY 25 MG TABLET: 30 days supply | Qty: 30 | Fill #3 | Status: AC

## 2018-09-06 MED FILL — VEMLIDY 25 MG TABLET: 30 days supply | Qty: 30 | Fill #3

## 2018-09-26 NOTE — Unmapped (Signed)
112019--pt requested [place] call back in few weeks he still had 20 days-I advised him to call us back if needed before -CB

## 2018-10-03 MED ORDER — TENOFOVIR ALAFENAMIDE 25 MG TABLET
5 refills | 0 days | Status: CP
Start: 2018-10-03 — End: 2019-03-26
  Filled 2018-10-11: qty 30, 30d supply, fill #0

## 2018-10-11 MED FILL — VEMLIDY 25 MG TABLET: 30 days supply | Qty: 30 | Fill #0 | Status: AC

## 2018-10-11 NOTE — Unmapped (Signed)
[facility] Pharmacy Refill and Clinical Coordination Note  Medication(s): Vemlidy 25mg     Giorgi Debruin, DOB: Jun 07, 1986  Phone: [phone] (home) , Alternate phone contact: N/A  Shipping address: [address]  [name]  [address]  Phone or address changes today?: No  All above HIPAA information verified.  Insurance changes? No    Completed refill and clinical call assessment today to schedule patient's medication shipment from the [facility] [phone]).      MEDICATION RECONCILIATION    Confirmed the medication and dosage are correct and have not changed: Yes, regimen is correct and unchanged.    Were there any changes to your medication(s) in the past month:  No, there are no changes reported at this time.    Marland Kitchen    HEPATITIS B AND ASSOCIATED LABS:       Lab Results   Component Value Date/Time    HBVDNAQT Not Detected 07/24/2018 09:58 AM    HBVDNAQT Not Detected 02/08/2018 12:24 PM    HBVDNAQT Not Detected 06/19/2017 10:16 AM    HBVDNACP <10 01/21/2016 12:16 PM    HBEAG Negative 07/24/2018 09:58 AM    HBEAG Negative 02/08/2018 12:24 PM    HBEAG Negative 06/19/2017 10:16 AM    HBSAG (A) 12/06/2016 09:50 AM     For final results see Hepatitis B Surface Antigen Neutralization    HBSAG (A) 08/16/2016 10:27 AM     For final results see Hepatitis B Surface Antigen Neutralization    HBSAG (A) 06/28/2016 10:30 AM     For final results see Hepatitis B Surface Antigen Neutralization    HEPBSAB Nonreactive 12/06/2016 09:50 AM    HEPBSAB Nonreactive 11/24/2015 10:22 AM    ALT 15 (L) 07/24/2018 09:58 AM    ALT 58 02/08/2018 12:24 PM    ALT 45 06/19/2017 10:16 AM    ALT 35 09/28/2015 03:20 PM    ALT 51 12/31/2014 11:33 AM    ALT 45 10/14/2014 10:55 AM    AST 21 07/24/2018 09:58 AM    AST 25 09/28/2015 03:20 PM    CREATININE 0.84 07/24/2018 09:58 AM    CREATININE 0.80 02/08/2018 12:24 PM    CREATININE 0.82 06/19/2017 10:16 AM    CREATININE 0.91 12/31/2014 11:33 AM    CREATININE 0.94 10/14/2014 10:55 AM    CREATININE 1.08 07/22/2014 10:55 AM       ADHERENCE    Is this medicine transplant or covered by Medicare Part B? No.    Vemlidy 25mg : Patient has 8 tablets on hand.    Did you miss any doses in the past 4 weeks? Yes.  Urho reports missing 1 days of medication therapy in the last 4 weeks.  Ibrohim reports forgetfulness as the cause of their non-adherance.  Adherence counseling provided? Yes, discussed the following ways to help with adherence: Continue using an audible alarm and consider adding a weekly pill box.     SIDE EFFECT MANAGEMENT    Are you tolerating your medication?:  Mahmoud reports tolerating the medication.  Side effect management discussed: None      Therapy is appropriate and should be continued.    Evidence of clinical benefit: See Epic note from 07/24/18      FINANCIAL/SHIPPING    Delivery Scheduled: Yes, Expected medication delivery date:  10/12/18     Medication will be delivered via UPS to the home address in [facility].    Additional medications refilled: No additional medications/refills needed at this time.  The patient will receive a drug information handout for each medication shipped and additional FDA Medication Guides as required.      Michail did not have any additional questions at this time.    Delivery address confirmed in Epic.     We will follow up with patient monthly for standard refill processing and delivery.      Thank you,  Roderic Palau   [facility] Shared [facility] Specialty Pharmacist

## 2018-11-05 NOTE — Unmapped (Signed)
[facility] Pharm[facility] Refill Coordination Note  Specialty Medication(s): Vemlidy 25mg   Additional Medications shipped:      Steven Nelson, DOB: 1986-03-05  Phone: [phone] (home) , Alternate phone contact: N/A  Phone or address changes today?: No  All above HIPAA information was verified with patient.  Shipping Address: [address]  [name] [address]   Insurance changes? No    Completed refill call assessment today to schedule patient's medication shipment from the [facility] [phone]).      Confirmed the medication and dosage are correct and have not changed: Yes, regimen is correct and unchanged.    Confirmed patient started or stopped the following medications in the past month:  No, there are no changes reported at this time.    Are you tolerating your medication?:  Steven Nelson reports tolerating the medication.    ADHERENCE    (Below is required for Medicare Part B or Transplant patients only - per drug):   How many tablets were dispensed last month: 30 tabs  Patient currently has 10 days remaining.    Did you miss any doses in the past 4 weeks? No missed doses reported.    FINANCIAL/SHIPPING    Delivery Scheduled: Yes, Expected medication delivery date: 010320     Medication will be delivered via UPS to the home address in [facility].    The patient will receive a drug information handout for each medication shipped and additional FDA Medication Guides as required.      Steven Nelson did not have any additional questions at this time.    We will follow up with patient monthly for standard refill processing and delivery.      Thank you,  Antonietta Barcelona   [facility] Specialty Technician

## 2018-11-08 MED FILL — VEMLIDY 25 MG TABLET: 30 days supply | Qty: 30 | Fill #1

## 2018-11-08 MED FILL — VEMLIDY 25 MG TABLET: 30 days supply | Qty: 30 | Fill #1 | Status: AC

## 2018-11-28 NOTE — Unmapped (Signed)
[facility] Pharmacy Refill Coordination Note  Specialty Medication(s): Vemlidy 25mg   Additional Medications shipped:       Steven Nelson, DOB: January 03, 1986  Phone: [phone] (home) , Alternate phone contact: N/A  Phone or address changes today?: No  All above HIPAA information was verified with patient.  Shipping Address: [address]  [name] [address]   Insurance changes? No    Completed refill call assessment today to schedule patient's medication shipment from the [facility] [phone]).      Confirmed the medication and dosage are correct and have not changed: Yes, regimen is correct and unchanged.    Confirmed patient started or stopped the following medications in the past month:  Yes. Steven Nelson reports starting the following medications: vit B-12    Are you tolerating your medication?:  Steven Nelson reports tolerating the medication.    ADHERENCE    (Below is required for Medicare Part B or Transplant patients only - per drug):   How many tablets were dispensed last month: 30 tabs  Patient currently has 14tabd remaining.    Did you miss any doses in the past 4 weeks? No missed doses reported.    FINANCIAL/SHIPPING    Delivery Scheduled: Yes, Expected medication delivery date: 013120     Medication will be delivered via UPS to the home address in [facility].    The patient will receive a drug information handout for each medication shipped and additional FDA Medication Guides as required.      Steven Nelson did not have any additional questions at this time.    We will follow up with patient monthly for standard refill processing and delivery.      Thank you,  Antonietta Barcelona   [facility] Pharm[facility] Specialty Technician

## 2018-12-06 MED FILL — VEMLIDY 25 MG TABLET: 30 days supply | Qty: 30 | Fill #2

## 2018-12-06 MED FILL — VEMLIDY 25 MG TABLET: 30 days supply | Qty: 30 | Fill #2 | Status: AC

## 2018-12-20 ENCOUNTER — Encounter: Admit: 2018-12-20 | Discharge: 2018-12-21 | Payer: BLUE CROSS/BLUE SHIELD

## 2018-12-20 DIAGNOSIS — B181 Chronic viral hepatitis B without delta-agent: Principal | ICD-10-CM

## 2018-12-20 LAB — CBC
MEAN CORPUSCULAR HEMOGLOBIN CONC: 32.7 g/dL (ref 31.0–37.0)
MEAN CORPUSCULAR HEMOGLOBIN: 25.7 pg — ABNORMAL LOW (ref 26.0–34.0)
MEAN CORPUSCULAR VOLUME: 78.5 fL — ABNORMAL LOW (ref 80.0–100.0)
MEAN PLATELET VOLUME: 8.3 fL (ref 7.0–10.0)
PLATELET COUNT: 207 10*9/L (ref 150–440)
RED CELL DISTRIBUTION WIDTH: 14.1 % (ref 12.0–15.0)
WBC ADJUSTED: 8.1 10*9/L (ref 4.5–11.0)

## 2018-12-20 LAB — BILIRUBIN TOTAL: Bilirubin:MCnc:Pt:Ser/Plas:Qn:: 0.4

## 2018-12-20 LAB — ALKALINE PHOSPHATASE
ALKALINE PHOSPHATASE: 76 U/L (ref 38–126)
Alkaline phosphatase:CCnc:Pt:Ser/Plas:Qn:: 76

## 2018-12-20 LAB — ALBUMIN: Albumin:MCnc:Pt:Ser/Plas:Qn:: 4.5

## 2018-12-20 LAB — BLOOD UREA NITROGEN: Urea nitrogen:MCnc:Pt:Ser/Plas:Qn:: 18

## 2018-12-20 LAB — AST (SGOT): Aspartate aminotransferase:CCnc:Pt:Ser/Plas:Qn:: 22

## 2018-12-20 LAB — CREATININE: Creatinine:MCnc:Pt:Ser/Plas:Qn:: 0.91

## 2018-12-20 LAB — PHOSPHORUS: Phosphate:MCnc:Pt:Ser/Plas:Qn:: 3.5

## 2018-12-20 LAB — MEAN CORPUSCULAR VOLUME: Lab: 78.5 — ABNORMAL LOW

## 2018-12-20 LAB — GLUCOSE FASTING: Glucose^post CFst:MCnc:Pt:Ser/Plas:Qn:: 106 — ABNORMAL HIGH

## 2018-12-20 LAB — ALT (SGPT): Alanine aminotransferase:CCnc:Pt:Ser/Plas:Qn:: 23

## 2018-12-20 NOTE — Unmapped (Signed)
[facility]      [phone]      Name:Steven Nelson   GNFA:[id number]   Age:33 y.o.   Sex:M   Race:Asian       CHIEF COMPLAINT: Study followup HBRN IA protocol.     HISTORY OF PRESENT ILLNESS: Steven Nelson is a 33 y.o.  [place] with chronic HBV originally HBeAg + disease. He is at week #408 of HBV cohort study, now on treatment. His first PEG injection 03/30/12 for immune active treatment trial, randomized PEG and tenofovir PEG reduced to 135 mcg for side effects including weight loss. He completed PEG at week 24. He has continued per protocol on tenofovir alone. He has persistently undetectable HBV DNA although his Hep Be Ag status fluctuates from positive to negative at various times.  He is scheduled for abdominal ultrasound with Doppler studies today.    Steven Nelson presents to clinic today alone. He continues to feel well and tolerating tenofovir without difficulty.  He states he has been 100% adherent to tenofovir since last visit.       PAST MEDICAL HISTORY:   1. Chronic hepatitis B, genotype C.   2. Dyslipidemia.   3. Lactose intolerance   4. 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   5. HCC surveillance: Hepatic U/S 10/19- ??Mildly heterogeneous echotexture of the liver without focal hepatic mass.  6. HAV immune  7. 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 He works in Set designer currently.      FAMILY HISTORY: There is no family history of hepatocellular carcinoma.     ALLERGIES: No known drug allergies.   CURRENT MEDICATIONS:   Tenofovir daily.       PHYSICAL EXAMINATION:   BP 131/78 (BP Site: R Arm, BP Position: Sitting, BP Cuff Size: Medium)  - Pulse 83  - Temp 36.5 ??C (97.7 ??F) (Oral)  - Resp 16  - Wt 57.3 kg (126 lb 4.8 oz)  - BMI 21.02 kg/m??     GENERAL: This is a thin [place] male in no acute distress.   Pleasant individual in NAD    HEENT: Sclera are anicteric, no temporal muscle loss, oropharynx is negative  NECK: No thyromegaly or lymphadenopathy, No carotid bruits  Chest: Clear to auscultation and percussion  Heart: S1, S2, RR, No murmurs  Abdomen: Soft, non-tender, non-distended, no hepatosplenomegaly, no masses appreciated, no ascites  Skin: No spider angiomata, No rashes  Extremities: Without pedal edema, no palmar erythema  Neuro: Grossly intact, No focal deficits      Labs:  Results for orders placed or performed in visit on 12/20/18   Phosphorus Level   Result Value Ref Range    Phosphorus 3.5 2.9 - 4.7 mg/dL   CBC   Result Value Ref Range    WBC 8.1 4.5 - 11.0 10*9/L    RBC 6.31 (H) 4.50 - 5.90 10*12/L    HGB 16.2 13.5 - 17.5 g/dL    HCT 96.2 95.2 - 84.1 %    MCV 78.5 (L) 80.0 - 100.0 fL    MCH 25.7 (L) 26.0 - 34.0 pg    MCHC 32.7 31.0 - 37.0 g/dL    RDW 32.4 40.1 - 02.7 %    MPV 8.3 7.0 - 10.0 fL    Platelet 207 150 - 440 10*9/L   Bilirubin, total   Result Value Ref Range    Total Bilirubin 0.4 0.0 - 1.2 mg/dL   Alkaline Phosphatase  Result Value Ref Range    Alkaline Phosphatase 76 38 - 126 U/L   ALT   Result Value Ref Range    ALT 23 <50 U/L   AST   Result Value Ref Range    AST 22 19 - 55 U/L   Albumin   Result Value Ref Range    Albumin 4.5 3.5 - 5.0 g/dL   Glucose, fasting   Result Value Ref Range    Glucose, Fasting 106 (H) 70 - 99 mg/dL   Creatinine   Result Value Ref Range    Creatinine 0.91 0.70 - 1.30 mg/dL    EGFR CKD-EPI Non-African American, Male >90 >=60 mL/min/1.69m2    EGFR CKD-EPI African American, Male >90 >=60 mL/min/1.49m2   BUN   Result Value Ref Range    BUN 18 7 - 21 mg/dL         ASSESSMENT: Patient is a 33 y.o. Asian male  Week# 408 of HBV Cohort trial. Completed PEG and continues on tenofovir. He has persistently undetectable HBV DNA although his Hep Be Ag status fluctuates from positive to negative at various times.    Up to date on [facility] screening.       Plan:  Continue tenofovir  Labs per protocol  F/U per protocol  HCC screening with f/u.        Earley Abide, NP-C  [facility]  [address] [place], [place] [address]  [place], [place] [address]  Ph: [phone]  Fax: [phone]

## 2018-12-21 LAB — AFP-TUMOR MARKER: Alpha-1-Fetoprotein.tumor marker:MCnc:Pt:Ser/Plas:Qn:: 1

## 2018-12-22 LAB — HEPATITIS BE ANTIBODY: Hepatitis B virus little e Ab.IgG:PrThr:Pt:Ser:Ord:IA: NEGATIVE

## 2018-12-22 LAB — HEPATITIS BE ANTIGEN: Hepatitis B virus little e Ag:PrThr:Pt:Ser/Plas:Ord:IA: NEGATIVE

## 2018-12-24 LAB — HBV DNA QUANT: Hepatitis B virus DNA:PrThr:Pt:Bld:Ord:Probe.amp.tar: NOT DETECTED

## 2018-12-31 NOTE — Unmapped (Signed)
[facility] Pharm[facility] Refill Coordination Note    Specialty Medication(s) to be Shipped:   Infectious Disease: Vemlidy    Other medication(s) to be shipped: none     Steven Nelson, DOB: Aug 19, 1986  Phone: [phone] (home)       All above HIPAA information was verified with patient.     Completed refill call assessment today to schedule patient's medication shipment from the [facility] [phone]).       Specialty medication(s) and dose(s) confirmed: Regimen is correct and unchanged.   Changes to medications: Steven Nelson reports no changes reported at this time.  Changes to insurance: No  Questions for the pharmacist: No    The patient will receive a drug information handout for each medication shipped and additional FDA Medication Guides as required.      DISEASE/MEDICATION-SPECIFIC INFORMATION        N/A    ADHERENCE     Medication Adherence    Patient reported X missed doses in the last month:  0              MEDICARE PART B DOCUMENTATION     Vemlidy: Patient has 10 days worth of on hand.    SHIPPING     Shipping address confirmed in Epic.     Delivery Scheduled: Yes, Expected medication delivery date: 01/09/19 via UPS or courier.     Medication will be delivered via UPS to the home address in Epic [facility].    Steven Nelson   [facility] Specialty Technician

## 2019-01-08 MED FILL — VEMLIDY 25 MG TABLET: 30 days supply | Qty: 30 | Fill #3 | Status: AC

## 2019-01-08 MED FILL — VEMLIDY 25 MG TABLET: 30 days supply | Qty: 30 | Fill #3

## 2019-02-01 NOTE — Unmapped (Signed)
[facility] Pharm[facility] Refill Coordination Note    Specialty Medication(s) to be Shipped:   Infectious Disease: Vemlidy    Other medication(s) to be shipped: N/A     Steven Nelson, DOB: 01/10/1986  Phone: [phone] (home)       All above HIPAA information was verified with patient.     Completed refill call assessment today to schedule patient's medication shipment from the [facility] [phone]).       Specialty medication(s) and dose(s) confirmed: Regimen is correct and unchanged.   Changes to medications: Dezmen reports no changes reported at this time.  Changes to insurance: No  Questions for the pharmacist: No    Confirmed patient received Welcome Packet with first shipment. The patient will receive a drug information handout for each medication shipped and additional FDA Medication Guides as required.       DISEASE/MEDICATION-SPECIFIC INFORMATION        N/A    SPECIALTY MEDICATION ADHERENCE     Medication Adherence    Patient reported X missed doses in the last month:  0  Specialty Medication:  VEMLIDY  Patient is on additional specialty medications:  No                VEMLIDY  : 10 days of medicine on hand         SHIPPING     Shipping address confirmed in Epic.     Delivery Scheduled: Yes, Expected medication delivery date: 3/31.     Medication will be delivered via UPS to the home address in Epic W[facility].    [name]   [facility] Pharmacy Specialty Technician

## 2019-02-04 MED FILL — VEMLIDY 25 MG TABLET: 30 days supply | Qty: 30 | Fill #4

## 2019-02-04 MED FILL — VEMLIDY 25 MG TABLET: 30 days supply | Qty: 30 | Fill #4 | Status: AC

## 2019-02-26 NOTE — Unmapped (Signed)
[facility] Pharmacy Clinical Assessment & Refill Coordination Note    Steven Nelson, DOB: 1985-12-04  Phone: [phone] (home)     All above HIPAA information was verified with patient.     Specialty Medication(s):   Infectious Disease: Vemlidy     Current Outpatient Medications   Medication Sig Dispense Refill   ??? entecavir (BARACLUDE) 0.5 MG tablet TAKE 1 TABLET BY MOUTH ONCE DAILY 30 each 0   ??? tenofovir (VIREAD) 300 mg tablet TAKE 1 TABLET BY MOUTH ONCE DAILY 14 tablet 1   ??? tenofovir alafenamide (VEMLIDY) 25 mg Tab tablet TAKE 1 TABLET BY MOUTH ONCE DAILY WITH FOOD 30 each 0   ??? tenofovir alafenamide (VEMLIDY) 25 mg Tab tablet TAKE ONE TABLET DAILY WITH FOOD 30 each 5     No current facility-administered medications for this visit.         Changes to medications: Steven Nelson reports no changes at this time.    No Known Allergies    Changes to allergies: No    SPECIALTY MEDICATION ADHERENCE     Vemlidy 25 mg: 14 days of medicine on hand       Medication Adherence    Patient reported X missed doses in the last month:  0  Specialty Medication:  Vemlidy 25mg   Patient is on additional specialty medications:  No  Informant:  patient          Specialty medication(s) dose(s) confirmed: Regimen is correct and unchanged.     Are there any concerns with adherence? No    Adherence counseling provided? Not needed    CLINICAL MANAGEMENT AND INTERVENTION      Clinical Benefit Assessment:    Do you feel the medicine is effective or helping your condition? Yes    HEPATITIS B AND ASSOCIATED LABS:       Lab Results   Component Value Date/Time    HBVDNAQT Not Detected 12/20/2018 11:40 AM    HBVDNAQT Not Detected 07/24/2018 09:58 AM    HBVDNAQT Not Detected 02/08/2018 12:24 PM    HBVDNACP <10 01/21/2016 12:16 PM    HBEAG Negative 12/20/2018 11:40 AM    HBEAG Negative 07/24/2018 09:58 AM    HBEAG Negative 02/08/2018 12:24 PM    HBSAG (A) 12/06/2016 09:50 AM     For final results see Hepatitis B Surface Antigen Neutralization    HBSAG (A) 08/16/2016 10:27 AM     For final results see Hepatitis B Surface Antigen Neutralization    HBSAG (A) 06/28/2016 10:30 AM     For final results see Hepatitis B Surface Antigen Neutralization    HBSAG see below 12/31/2014 11:33 AM    HBSAG see below 02/04/2014 02:00 PM    HBSAG see below 02/28/2013 09:57 AM    HEPBSAB Nonreactive 12/06/2016 09:50 AM    HEPBSAB Nonreactive 11/24/2015 10:22 AM    ALT 23 12/20/2018 11:40 AM    ALT 15 (L) 07/24/2018 09:58 AM    ALT 58 02/08/2018 12:24 PM    ALT 35 09/28/2015 03:20 PM    ALT 51 12/31/2014 11:33 AM    ALT 45 10/14/2014 10:55 AM    AST 22 12/20/2018 11:40 AM    AST 25 09/28/2015 03:20 PM    CREATININE 0.91 12/20/2018 11:40 AM    CREATININE 0.84 07/24/2018 09:58 AM    CREATININE 0.80 02/08/2018 12:24 PM    CREATININE 0.91 12/31/2014 11:33 AM    CREATININE 0.94 10/14/2014 10:55 AM    CREATININE  1.08 07/22/2014 10:55 AM       Clinical Benefit counseling provided? Labs from 12/20/2018 show evidence of clinical benefit    Adverse Effects Assessment:    Are you experiencing any side effects? No    Are you experiencing difficulty administering your medicine? No    Quality of Life Assessment:    How many days over the past month did your hepatitis B  keep you from your normal activities? For example, brushing your teeth or getting up in the morning. 0    Have you discussed this with your provider? Not needed    Therapy Appropriateness:    Is therapy appropriate? Yes, therapy is appropriate and should be continued    DISEASE/MEDICATION-SPECIFIC INFORMATION      N/A    PATIENT SPECIFIC NEEDS     ? Does the patient have any physical, cognitive, or cultural barriers? No    ? Is the patient high risk? No     ? Does the patient require a Care Management Plan? No     ? Does the patient require physician intervention or other additional services (i.e. nutrition, smoking cessation, social work)? No      SHIPPING     Specialty Medication(s) to be Shipped: Infectious Disease: Vemlidy    Other medication(s) to be shipped: n/a     Changes to insurance: No    Delivery Scheduled: Yes, Expected medication delivery date: 03/05/2019.     Medication will be delivered via UPS to the confirmed home address in [facility].    The patient will receive a drug information handout for each medication shipped and additional FDA Medication Guides as required.  Verified that patient has previously received a Conservation officer, historic buildings.    Roderic Palau   [facility] Specialty Pharmacist

## 2019-03-04 MED FILL — VEMLIDY 25 MG TABLET: 30 days supply | Qty: 30 | Fill #5 | Status: AC

## 2019-03-04 MED FILL — VEMLIDY 25 MG TABLET: 30 days supply | Qty: 30 | Fill #5

## 2019-03-26 NOTE — Unmapped (Signed)
[facility] Refill Coordination Note    Specialty Medication(s) to be Shipped:   Infectious Disease: Vemlidy    Other medication(s) to be shipped: N/A     Steven Nelson, DOB: 1986-03-01  Phone: [phone] (home)       All above HIPAA information was verified with patient.     Completed refill call assessment today to schedule patient's medication shipment from the [facility] [phone]).       Specialty medication(s) and dose(s) confirmed: Regimen is correct and unchanged.   Changes to medications: [name] reports no changes at this time.  Changes to insurance: No  Questions for the pharmacist: No    Confirmed patient received Welcome Packet with first shipment. The patient will receive a drug information handout for each medication shipped and additional FDA Medication Guides as required.       DISEASE/MEDICATION-SPECIFIC INFORMATION        N/A    SPECIALTY MEDICATION ADHERENCE     Medication Adherence    Patient reported X missed doses in the last month:  2  Specialty Medication:  VEMLIDY- PT WENT OUT OF TOWN, AND FORGOT MEDICATION                VEMLIDY  : 14 days of medicine on hand         SHIPPING     Shipping address confirmed in Epic.     Delivery Scheduled: Yes, Expected medication delivery date: 5/27.     Medication will be delivered via UPS to the home address in Epic [facility].    Steven Nelson   [facility] Specialty Technician

## 2019-03-29 MED ORDER — TENOFOVIR ALAFENAMIDE 25 MG TABLET
5 refills | 0 days | Status: CP
Start: 2019-03-29 — End: 2020-03-28
  Filled 2019-04-02: qty 30, 30d supply, fill #0

## 2019-04-02 MED FILL — VEMLIDY 25 MG TABLET: 30 days supply | Qty: 30 | Fill #0 | Status: AC

## 2019-04-30 NOTE — Unmapped (Signed)
[facility] Pharmacy Refill Coordination Note    Specialty Medication(s) to be Shipped:   Infectious Disease: Vemlidy    Other medication(s) to be shipped:       Steven Nelson, DOB: 1986/01/05  Phone: [phone] (home)       All above HIPAA information was verified with patient.     Completed refill call assessment today to schedule patient's medication shipment from the [facility] [phone]).       Specialty medication(s) and dose(s) confirmed: Regimen is correct and unchanged.   Changes to medications: Tryston reports no changes at this time.  Changes to insurance: No  Questions for the pharmacist: No    Confirmed patient received Welcome Packet with first shipment. The patient will receive a drug information handout for each medication shipped and additional FDA Medication Guides as required.       DISEASE/MEDICATION-SPECIFIC INFORMATION        N/A    SPECIALTY MEDICATION ADHERENCE     Medication Adherence    Patient reported X missed doses in the last month:  0  Specialty Medication:  VEMLIDY  Patient is on additional specialty medications:  No                Vemlidy 25 mg: 10 days of medicine on hand       SHIPPING     Shipping address confirmed in Epic.     Delivery Scheduled: Yes, Expected medication delivery date: 06/26.     Medication will be delivered via UPS to the home address in Epic W[facility].    Antonietta Barcelona   [facility] Specialty Technician

## 2019-05-02 MED FILL — VEMLIDY 25 MG TABLET: 30 days supply | Qty: 30 | Fill #1 | Status: AC

## 2019-05-02 MED FILL — VEMLIDY 25 MG TABLET: 30 days supply | Qty: 30 | Fill #1

## 2019-06-03 NOTE — Unmapped (Signed)
[facility] Special[facility] Pharmacy Refill Coordination Note    Specialty Medication(s) to be Shipped:   Infectious Disease: Vemlidy    Other medication(s) to be shipped: N/A     Steven Nelson, DOB: 03-02-1986  Phone: [phone] (home)       All above HIPAA information was verified with patient.     Completed refill call assessment today to schedule patient's medication shipment from the [facility] [phone]).       Specialty medication(s) and dose(s) confirmed: Regimen is correct and unchanged.   Changes to medications: Osmel reports no changes at this time.  Changes to insurance: No  Questions for the pharmacist: No    Confirmed patient received Welcome Packet with first shipment. The patient will receive a drug information handout for each medication shipped and additional FDA Medication Guides as required.       DISEASE/MEDICATION-SPECIFIC INFORMATION        N/A    SPECIALTY MEDICATION ADHERENCE     Medication Adherence    Patient reported X missed doses in the last month: 0  Specialty Medication: VEMLIDY                VEMLIDY  : 12 days of medicine on hand         SHIPPING     Shipping address confirmed in Epic.     Delivery Scheduled: Yes, Expected medication delivery date: 7/30.     Medication will be delivered via UPS to the home address in Epic WAM.    [name]   [facility] Specialty Technician

## 2019-06-05 MED FILL — VEMLIDY 25 MG TABLET: 30 days supply | Qty: 30 | Fill #2 | Status: AC

## 2019-06-05 MED FILL — VEMLIDY 25 MG TABLET: 30 days supply | Qty: 30 | Fill #2

## 2019-07-02 NOTE — Unmapped (Signed)
[facility] Special[facility] Pharmacy Refill Coordination Note    Specialty Medication(s) to be Shipped:   Infectious Disease: Vemlidy 25 mg       Steven Nelson, DOB: September 23, 1986  Phone: [phone] (home)       All above HIPAA information was verified with patient.     Completed refill call assessment today to schedule patient's medication shipment from the [facility] [phone]).       Specialty medication(s) and dose(s) confirmed: Regimen is correct and unchanged.   Changes to medications: Niranjan reports no changes at this time.  Changes to insurance: No  Questions for the pharmacist: No    Confirmed patient received Welcome Packet with first shipment. The patient will receive a drug information handout for each medication shipped and additional FDA Medication Guides as required.       DISEASE/MEDICATION-SPECIFIC INFORMATION        N/A    SPECIALTY MEDICATION ADHERENCE     Medication Adherence    Patient reported X missed doses in the last month: 0  Specialty Medication: Vemlidy 25 mg  Patient is on additional specialty medications: No  Patient is on more than two specialty medications: No  Any gaps in refill history greater than 2 weeks in the last 3 months: no  Demonstrates understanding of importance of adherence: yes  Informant: patient  Reliability of informant: reliable  Confirmed plan for next specialty medication refill: delivery by pharmacy          Refill Coordination    Has the Patients' Contact Information Changed: No  Is the Shipping Address Different: No           Vemlidy 25 mg  : 10 days of medicine on hand       SHIPPING     Shipping address confirmed in Epic.     Delivery Scheduled: Yes, Expected medication delivery date: 07/09/2019.     Medication will be delivered via UPS to the home address in Epic WAM.    Jorje Guild   [facility] Specialty Technician

## 2019-07-03 ENCOUNTER — Other Ambulatory Visit: Payer: Self-pay

## 2019-07-03 DIAGNOSIS — Z20822 Contact with and (suspected) exposure to covid-19: Secondary | ICD-10-CM

## 2019-07-04 LAB — NOVEL CORONAVIRUS, NAA: SARS-CoV-2, NAA: DETECTED — AB

## 2019-07-08 NOTE — Unmapped (Signed)
Steven Nelson 's vemlidy shipment will be delayed as a result of the patient's insurance being terminated.  Patients trail card expired    I have reached out to the patient and communicated the delay. We will call the patient back to reschedule the delivery upon resolution.

## 2019-07-10 ENCOUNTER — Other Ambulatory Visit: Payer: Self-pay

## 2019-07-10 DIAGNOSIS — Z20822 Contact with and (suspected) exposure to covid-19: Secondary | ICD-10-CM

## 2019-07-11 LAB — NOVEL CORONAVIRUS, NAA: SARS-CoV-2, NAA: NOT DETECTED

## 2019-07-19 MED FILL — VEMLIDY 25 MG TABLET: 30 days supply | Qty: 30 | Fill #3 | Status: AC

## 2019-07-19 MED FILL — VEMLIDY 25 MG TABLET: 30 days supply | Qty: 30 | Fill #3

## 2019-07-19 NOTE — Unmapped (Signed)
Steven Nelson's Steven Nelson has been scheduled for delivery on 9/12 because patient only has 1 on hand

## 2019-07-19 NOTE — Unmapped (Signed)
Steven Nelson 's VEMLIDY shipment will be delayed due to MFG ASSISTANCE RENEWAL We have contacted the patient and left a message We will wait for a call back from the patient/caregiver to reschedule the delivery.

## 2019-08-12 NOTE — Unmapped (Signed)
[facility] Pharmacy Clinical Assessment & Refill Coordination Note     Steven Nelson, DOB: 03/29/1986  Phone: [phone] (home)     All above HIPAA information was verified with patient.     Specialty Medication(s):   Infectious Disease: Vemlidy     Current Outpatient Medications   Medication Sig Dispense Refill   ??? entecavir (BARACLUDE) 0.5 MG tablet TAKE 1 TABLET BY MOUTH ONCE DAILY 30 each 0   ??? tenofovir (VIREAD) 300 mg tablet TAKE 1 TABLET BY MOUTH ONCE DAILY 14 tablet 1   ??? tenofovir alafenamide (VEMLIDY) 25 mg Tab tablet TAKE 1 TABLET BY MOUTH ONCE DAILY WITH FOOD 30 each 0   ??? tenofovir alafenamide (VEMLIDY) 25 mg Tab tablet Take 1 tablet by mouth daily with food 30 each 5     No current facility-administered medications for this visit.         Changes to medications: Steven Nelson reports no changes at this time.    No Known Allergies    Changes to allergies: No    SPECIALTY MEDICATION ADHERENCE     Vemlidy 25 mg: 10 days of medicine on hand       Medication Adherence    Patient reported X missed doses in the last month: 0  Specialty Medication: Vemlidy 25mg   Patient is on additional specialty medications: No  Demonstrates understanding of importance of adherence: yes  Informant: patient  Provider-estimated medication adherence level: good  Patient is at risk for Non-Adherence: No          Specialty medication(s) dose(s) confirmed: Regimen is correct and unchanged.     Are there any concerns with adherence? No    Adherence counseling provided? Not needed    CLINICAL MANAGEMENT AND INTERVENTION      Clinical Benefit Assessment:    Do you feel the medicine is effective or helping your condition? Yes      HEPATITIS B AND ASSOCIATED LABS:       Lab Results   Component Value Date/Time    HBVDNAQT Not Detected 12/20/2018 11:40 AM    HBVDNAQT Not Detected 07/24/2018 09:58 AM    HBVDNAQT Not Detected 02/08/2018 12:24 PM    HBVDNACP <10 01/21/2016 12:16 PM    HBEAG Negative 12/20/2018 11:40 AM HBEAG Negative 07/24/2018 09:58 AM    HBEAG Negative 02/08/2018 12:24 PM    HBSAG (A) 12/06/2016 09:50 AM     For final results see Hepatitis B Surface Antigen Neutralization    HBSAG (A) 08/16/2016 10:27 AM     For final results see Hepatitis B Surface Antigen Neutralization    HBSAG (A) 06/28/2016 10:30 AM     For final results see Hepatitis B Surface Antigen Neutralization    HBSAG see below 12/31/2014 11:33 AM    HBSAG see below 02/04/2014 02:00 PM    HBSAG see below 02/28/2013 09:57 AM    HEPBSAB Nonreactive 12/06/2016 09:50 AM    HEPBSAB Nonreactive 11/24/2015 10:22 AM    ALT 23 12/20/2018 11:40 AM    ALT 15 (L) 07/24/2018 09:58 AM    ALT 58 02/08/2018 12:24 PM    ALT 35 09/28/2015 03:20 PM    ALT 51 12/31/2014 11:33 AM    ALT 45 10/14/2014 10:55 AM    AST 22 12/20/2018 11:40 AM    AST 25 09/28/2015 03:20 PM    CREATININE 0.91 12/20/2018 11:40 AM    CREATININE 0.84 07/24/2018 09:58 AM    CREATININE 0.80 02/08/2018 12:24 PM  CREATININE 0.91 12/31/2014 11:33 AM    CREATININE 0.94 10/14/2014 10:55 AM    CREATININE 1.08 07/22/2014 10:55 AM       Clinical Benefit counseling provided? Labs from 12/20/2018 show evidence of clinical benefit    Adverse Effects Assessment:    Are you experiencing any side effects? No    Are you experiencing difficulty administering your medicine? No    Quality of Life Assessment:    How many days over the past month did your Hepatitis B  keep you from your normal activities? For example, brushing your teeth or getting up in the morning. 0    Have you discussed this with your provider? Not needed    Therapy Appropriateness:    Is therapy appropriate? Yes, therapy is appropriate and should be continued    DISEASE/MEDICATION-SPECIFIC INFORMATION      N/A    PATIENT SPECIFIC NEEDS     ? Does the patient have any physical, cognitive, or cultural barriers? No    ? Is the patient high risk? No     ? Does the patient require a Care Management Plan? No     ? Does the patient require physician intervention or other additional services (i.e. nutrition, smoking cessation, social work)? No      SHIPPING     Specialty Medication(s) to be Shipped:   Infectious Disease: Vemlidy    Other medication(s) to be shipped: n/a     Changes to insurance: No    Delivery Scheduled: Yes, Expected medication delivery date: 08/16/2019.     Medication will be delivered via UPS to the confirmed home address in [facility].    The patient will receive a drug information handout for each medication shipped and additional FDA Medication Guides as required.  Verified that patient has previously received a Conservation officer, historic buildings.    All of the patient's questions and concerns have been addressed.    Steven Nelson   [facility] Shared [facility] Specialty Pharmacist

## 2019-08-15 MED FILL — VEMLIDY 25 MG TABLET: 30 days supply | Qty: 30 | Fill #4 | Status: AC

## 2019-08-15 MED FILL — VEMLIDY 25 MG TABLET: 30 days supply | Qty: 30 | Fill #4

## 2019-09-10 NOTE — Unmapped (Signed)
[facility] Specialty Pharmacy Refill Coordination Note    Specialty Medication(s) to be Shipped:   Infectious Disease: Vemlidy    Other medication(s) to be shipped: N/A     Steven Nelson, DOB: July 01, 1986  Phone: [phone] (home)       All above HIPAA information was verified with patient.     Completed refill call assessment today to schedule patient's medication shipment from the [facility] [phone]).       Specialty medication(s) and dose(s) confirmed: Regimen is correct and unchanged.   Changes to medications: Achillies reports no changes at this time.  Changes to insurance: No  Questions for the pharmacist: No    Confirmed patient received Welcome Packet with first shipment. The patient will receive a drug information handout for each medication shipped and additional FDA Medication Guides as required.       DISEASE/MEDICATION-SPECIFIC INFORMATION        N/A    SPECIALTY MEDICATION ADHERENCE     Medication Adherence    Patient reported X missed doses in the last month: 0  Specialty Medication: vemlidy 25mg   Patient is on additional specialty medications: No          Vemlidy 25 mg: 8 days of medicine on hand     SHIPPING     Shipping address confirmed in Epic.     Delivery Scheduled: Yes, Expected medication delivery date: 09/17/2019.     Medication will be delivered via UPS to the prescription address in Epic [facility].    Lorelei Pont [facility] Specialty Technician

## 2019-09-16 MED FILL — VEMLIDY 25 MG TABLET: 30 days supply | Qty: 30 | Fill #5 | Status: AC

## 2019-09-16 MED FILL — VEMLIDY 25 MG TABLET: 30 days supply | Qty: 30 | Fill #5

## 2019-10-08 DIAGNOSIS — B181 Chronic viral hepatitis B without delta-agent: Principal | ICD-10-CM

## 2019-10-10 DIAGNOSIS — B181 Chronic viral hepatitis B without delta-agent: Principal | ICD-10-CM

## 2019-10-14 DIAGNOSIS — B181 Chronic viral hepatitis B without delta-agent: Principal | ICD-10-CM

## 2019-10-14 NOTE — Unmapped (Signed)
[facility] Specialty Pharmacy Refill Coordination Note    Specialty Medication(s) to be Shipped:   Infectious Disease: Vemlidy    Other medication(s) to be shipped: n/a     Steven Nelson, DOB: 02-27-1986  Phone: [phone] (home)       All above HIPAA information was verified with patient.     Was a Nurse, learning disability used for this call? No    Completed refill call assessment today to schedule patient's medication shipment from the [facility] [phone]).       Specialty medication(s) and dose(s) confirmed: Regimen is correct and unchanged.   Changes to medications: [name] reports no changes at this time.  Changes to insurance: No  Questions for the pharmacist: No    Confirmed patient received Welcome Packet with first shipment. The patient will receive a drug information handout for each medication shipped and additional FDA Medication Guides as required.       DISEASE/MEDICATION-SPECIFIC INFORMATION        N/A    SPECIALTY MEDICATION ADHERENCE     Medication Adherence    Patient reported X missed doses in the last month: 0  Specialty Medication: vemlidy 25mg                 vemlidy 25mg   : 7 days of medicine on hand         SHIPPING     Shipping address confirmed in Epic.     Delivery Scheduled: Yes, Expected medication delivery date: 12/10.     Medication will be delivered via UPS to the prescription address in Epic WAM.    Steven Nelson   [facility] Specialty Technician

## 2019-10-15 MED ORDER — VEMLIDY 25 MG TABLET
5 refills | 0 days | Status: CP
Start: 2019-10-15 — End: 2020-10-14
  Filled 2019-10-16: qty 30, 30d supply, fill #0

## 2019-10-16 MED FILL — VEMLIDY 25 MG TABLET: 30 days supply | Qty: 30 | Fill #0 | Status: AC

## 2019-11-13 NOTE — Unmapped (Signed)
[facility] Specialty Pharmacy Refill Coordination Note    Specialty Medication(s) to be Shipped:   Infectious Disease: Vemlidy    Other medication(s) to be shipped: N/A     Steven Nelson, DOB: [birth year]  Phone: [phone] (home)       All above HIPAA information was verified with patient.     Was a Nurse, learning disability used for this call? No    Completed refill call assessment today to schedule patient's medication shipment from the [facility] [phone]).       Specialty medication(s) and dose(s) confirmed: Regimen is correct and unchanged.   Changes to medications: Steven Nelson reports no changes at this time.  Changes to insurance: No  Questions for the pharmacist: No    Confirmed patient received Welcome Packet with first shipment. The patient will receive a drug information handout for each medication shipped and additional FDA Medication Guides as required.       DISEASE/MEDICATION-SPECIFIC INFORMATION        N/A    SPECIALTY MEDICATION ADHERENCE     Medication Adherence    Patient reported X missed doses in the last month: 0  Specialty Medication: vemlidy 25mg   Patient is on additional specialty medications: No          Vemlidy 25 mg: 6 days of medicine on hand     SHIPPING     Shipping address confirmed in Epic.     Delivery Scheduled: Yes, Expected medication delivery date: 11/15/2019.     Medication will be delivered via UPS to the prescription address in Epic WAM.    Steven Nelson [facility] Specialty Technician

## 2019-11-14 MED FILL — VEMLIDY 25 MG TABLET: 30 days supply | Qty: 30 | Fill #1 | Status: AC

## 2019-11-14 MED FILL — VEMLIDY 25 MG TABLET: 30 days supply | Qty: 30 | Fill #1

## 2019-12-10 NOTE — Unmapped (Signed)
[facility] Specialty Pharmacy Refill Coordination Note    Specialty Medication(s) to be Shipped:   Infectious Disease: Vemlidy    Other medication(s) to be shipped: none     Steven Nelson, DOB: 09-18-1986  Phone: [phone] (home)       All above HIPAA information was verified with patient.     Was a Nurse, learning disability used for this call? No    Completed refill call assessment today to schedule patient's medication shipment from the [facility] [phone]).       Specialty medication(s) and dose(s) confirmed: Regimen is correct and unchanged.   Changes to medications: Owenn reports no changes at this time.  Changes to insurance: No  Questions for the pharmacist: No    Confirmed patient received Welcome Packet with first shipment. The patient will receive a drug information handout for each medication shipped and additional FDA Medication Guides as required.       DISEASE/MEDICATION-SPECIFIC INFORMATION        N/A    SPECIALTY MEDICATION ADHERENCE     Medication Adherence    Patient reported X missed doses in the last month: 0  Specialty Medication: vemlidy 25mg             Vemlidy: 7 days worth of medication on hand.        SHIPPING     Shipping address confirmed in Epic.     Delivery Scheduled: Yes, Expected medication delivery date: 12/12/19.     Medication will be delivered via UPS to the prescription address in Epic WAM.    [place] A Jaena Brocato   [facility] Shared [facility] Pharm[facility] Specialty Technician

## 2019-12-11 MED FILL — VEMLIDY 25 MG TABLET: 30 days supply | Qty: 30 | Fill #2 | Status: AC

## 2019-12-11 MED FILL — VEMLIDY 25 MG TABLET: 30 days supply | Qty: 30 | Fill #2

## 2020-01-06 NOTE — Unmapped (Signed)
[facility] Shared [facility] Special[facility] Pharmacy Clinical Assessment & Refill Coordination Note    Steven Nelson, DOB: [birth year]  Phone: [phone] (home)     All above HIPAA information was verified with patient.     Was a Nurse, learning disability used for this call? No    Specialty Medication(s):   Infectious Disease: Vemlidy     Current Outpatient Medications   Medication Sig Dispense Refill   ??? tenofovir alafenamide (VEMLIDY) 25 mg Tab tablet Take 1 tablet by mouth daily with food 30 each 5     No current facility-administered medications for this visit.         Changes to medications: Steven Nelson reports no changes at this time.    No Known Allergies    Changes to allergies: No    SPECIALTY MEDICATION ADHERENCE     Vemlidy 25 mg: 10 days of medicine on hand       Medication Adherence    Patient reported X missed doses in the last month: 0  Specialty Medication: Vemlidy 25mg   Patient is on additional specialty medications: No  Any gaps in refill history greater than 2 weeks in the last 3 months: no  Demonstrates understanding of importance of adherence: yes  Informant: patient  Provider-estimated medication adherence level: good  Patient is at risk for Non-Adherence: No          Specialty medication(s) dose(s) confirmed: Regimen is correct and unchanged.     Are there any concerns with adherence? No    Adherence counseling provided? Not needed    CLINICAL MANAGEMENT AND INTERVENTION      Clinical Benefit Assessment:    Do you feel the medicine is effective or helping your condition? Yes      HEPATITIS B AND ASSOCIATED LABS:       Lab Results   Component Value Date/Time    HBVDNAQT Not Detected 12/20/2018 11:40 AM    HBVDNAQT Not Detected 07/24/2018 09:58 AM    HBVDNAQT Not Detected 02/08/2018 12:24 PM    HBVDNACP <10 01/21/2016 12:16 PM    HBEAG Negative 12/20/2018 11:40 AM    HBEAG Negative 07/24/2018 09:58 AM    HBEAG Negative 02/08/2018 12:24 PM    HBSAG (A) 12/06/2016 09:50 AM     For final results see Hepatitis B Surface Antigen Neutralization    HBSAG (A) 08/16/2016 10:27 AM     For final results see Hepatitis B Surface Antigen Neutralization    HBSAG (A) 06/28/2016 10:30 AM     For final results see Hepatitis B Surface Antigen Neutralization    HBSAG see below 12/31/2014 11:33 AM    HBSAG see below 02/04/2014 02:00 PM    HBSAG see below 02/28/2013 09:57 AM    HEPBSAB Nonreactive 12/06/2016 09:50 AM    HEPBSAB Nonreactive 11/24/2015 10:22 AM    ALT 23 12/20/2018 11:40 AM    ALT 15 (L) 07/24/2018 09:58 AM    ALT 58 02/08/2018 12:24 PM    ALT 35 09/28/2015 03:20 PM    ALT 51 12/31/2014 11:33 AM    ALT 45 10/14/2014 10:55 AM    AST 22 12/20/2018 11:40 AM    AST 25 09/28/2015 03:20 PM    CREATININE 0.91 12/20/2018 11:40 AM    CREATININE 0.84 07/24/2018 09:58 AM    CREATININE 0.80 02/08/2018 12:24 PM    CREATININE 0.91 12/31/2014 11:33 AM    CREATININE 0.94 10/14/2014 10:55 AM    CREATININE 1.08 07/22/2014 10:55 AM  Clinical Benefit counseling provided? Labs from 12/20/2018 show evidence of clinical benefit    Adverse Effects Assessment:    Are you experiencing any side effects? No    Are you experiencing difficulty administering your medicine? No    Quality of Life Assessment:    How many days over the past month did your Hepatitis B  keep you from your normal activities? For example, brushing your teeth or getting up in the morning. 0    Have you discussed this with your provider? Not needed    Therapy Appropriateness:    Is therapy appropriate? Yes, therapy is appropriate and should be continued    DISEASE/MEDICATION-SPECIFIC INFORMATION      N/A    PATIENT SPECIFIC NEEDS     ? Does the patient have any physical, cognitive, or cultural barriers? No    ? Is the patient high risk? No     ? Does the patient require a Care Management Plan? No     ? Does the patient require physician intervention or other additional services (i.e. nutrition, smoking cessation, social work)? No      SHIPPING     Specialty Medication(s) to be Shipped: Infectious Disease: Vemlidy    Other medication(s) to be shipped: n/a     Changes to insurance: No    Delivery Scheduled: Yes, Expected medication delivery date: 01/09/2020.     Medication will be [facility]    The patient will receive a drug information handout for each medication shipped and additional FDA Medication Guides as required.  Verified that patient has previously received a Conservation officer, historic buildings.    All of the patient's questions and concerns have been addressed.    Roderic Palau   [facility] Shared [facility] Specialty Pharmacist

## 2020-01-08 MED FILL — VEMLIDY 25 MG TABLET: 30 days supply | Qty: 30 | Fill #3 | Status: AC

## 2020-01-08 MED FILL — VEMLIDY 25 MG TABLET: 30 days supply | Qty: 30 | Fill #3

## 2020-02-05 NOTE — Unmapped (Signed)
[facility] Specialty Pharmacy Refill Coordination Note    Specialty Medication(s) to be Shipped:   Infectious Disease: Vemlidy    Other medication(s) to be shipped: n/a     Steven Nelson, DOB: 10/09/1986  Phone: [phone] (home)       All above HIPAA information was verified with patient.     Was a Nurse, learning disability used for this call? No    Completed refill call assessment today to schedule patient's medication shipment from the [facility] [phone]).       Specialty medication(s) and dose(s) confirmed: Regimen is correct and unchanged.   Changes to medications: Jerardo reports no changes at this time.  Changes to insurance: No  Questions for the pharmacist: No    Confirmed patient received Welcome Packet with first shipment. The patient will receive a drug information handout for each medication shipped and additional FDA Medication Guides as required.       DISEASE/MEDICATION-SPECIFIC INFORMATION        N/A    SPECIALTY MEDICATION ADHERENCE     Medication Adherence    Patient reported X missed doses in the last month: 0  Specialty Medication: vemlidy 25mg                 vemlidy 25mg   : 14 days of medicine on hand         SHIPPING     Shipping address confirmed in Epic.     Delivery Scheduled: Yes, Expected medication delivery date: 4/2.     Medication will be delivered via UPS to the prescription address in Epic WAM.    [name]   [facility] Specialty Technician

## 2020-02-06 MED FILL — VEMLIDY 25 MG TABLET: 30 days supply | Qty: 30 | Fill #4 | Status: AC

## 2020-02-06 MED FILL — VEMLIDY 25 MG TABLET: 30 days supply | Qty: 30 | Fill #4

## 2020-02-19 ENCOUNTER — Ambulatory Visit: Admit: 2020-02-19 | Discharge: 2020-02-20 | Attending: Gastroenterology | Primary: Gastroenterology

## 2020-02-19 DIAGNOSIS — B181 Chronic viral hepatitis B without delta-agent: Principal | ICD-10-CM

## 2020-02-19 LAB — CBC
HEMOGLOBIN: 15.6 g/dL (ref 13.5–17.5)
MEAN CORPUSCULAR HEMOGLOBIN CONC: 32.8 g/dL (ref 30.0–36.0)
MEAN CORPUSCULAR HEMOGLOBIN: 25.4 pg — ABNORMAL LOW (ref 26.0–34.0)
MEAN CORPUSCULAR VOLUME: 77.4 fL — ABNORMAL LOW (ref 81.0–95.0)
MEAN PLATELET VOLUME: 8.8 fL (ref 7.0–10.0)
PLATELET COUNT: 212 10*9/L (ref 150–450)
RED BLOOD CELL COUNT: 6.14 10*12/L — ABNORMAL HIGH (ref 4.32–5.72)
RED CELL DISTRIBUTION WIDTH: 14.4 % (ref 12.0–15.0)
WBC ADJUSTED: 9.6 10*9/L (ref 3.5–10.5)

## 2020-02-19 LAB — COMPREHENSIVE METABOLIC PANEL
ALBUMIN: 4.7 g/dL (ref 3.4–5.0)
ALT (SGPT): 22 U/L (ref 10–49)
ANION GAP: 7 mmol/L (ref 3–11)
AST (SGOT): 18 U/L (ref <34)
BILIRUBIN TOTAL: 0.4 mg/dL (ref 0.3–1.2)
BLOOD UREA NITROGEN: 14 mg/dL (ref 9–23)
BUN / CREAT RATIO: 16
CALCIUM: 10.2 mg/dL (ref 8.7–10.4)
CHLORIDE: 102 mmol/L (ref 98–107)
CO2: 28.2 mmol/L (ref 20.0–31.0)
CREATININE: 0.89 mg/dL (ref 0.60–1.10)
EGFR CKD-EPI AA MALE: 131 mL/min/{1.73_m2}
EGFR CKD-EPI NON-AA MALE: >90 mL/min/{1.73_m2}
GLUCOSE RANDOM: 84 mg/dL (ref 70–179)
POTASSIUM: 3.7 mmol/L (ref 3.5–5.1)
PROTEIN TOTAL: 8.2 g/dL (ref 5.7–8.2)
SODIUM: 137 mmol/L (ref 135–145)

## 2020-02-19 LAB — EGFR CKD-EPI AA MALE: Glomerular filtration rate/1.73 sq M.predicted.black:ArVRat:Pt:Ser/Plas/Bld:Qn:Creatinine-based formula (CKD-EPI): 131

## 2020-02-19 LAB — MEAN CORPUSCULAR HEMOGLOBIN: Erythrocyte mean corpuscular hemoglobin:EntMass:Pt:RBC:Qn:Automated count: 25.4 — ABNORMAL LOW

## 2020-02-19 LAB — AFP-TUMOR MARKER: Alpha-1-Fetoprotein.tumor marker:MCnc:Pt:Ser/Plas:Qn:: 1

## 2020-02-19 NOTE — Unmapped (Signed)
[facility] L[facility]      [phone]      Name:Jackston Guardiola   VHQI:[id number]   Age:34 y.o.   Sex:M   Race:Asian       CHIEF COMPLAINT: Study followup after completing [facility] IA protocol. Last visit in 12/2018.     HISTORY OF PRESENT ILLNESS: Mr. Sakuma is a 34 y.o.  [place] with chronic HBV originally HBeAg + disease. He was in HBV cohort study then transitioned to treatment. His first PEG injection 03/30/12 for immune active treatment trial, randomized PEG and tenofovir PEG reduced to 135 mcg for side effects including weight loss. He completed PEG at week 24. He has continued per protocol on tenofovir alone. He has persistently undetectable HBV DNA although his Hep Be Ag status fluctuates from positive to negative at various times.      Interval history: Patient feels well. Had Covid last year. No lingering symptoms. Denies N/V abd pain, chest pain, or SOB. Working full time. 100% adherent to [facility] (switched from tenofovir on June 2019).     PAST MEDICAL HISTORY:   1. Chronic hepatitis B, genotype C.   2. Dyslipidemia.   3. Lactose intolerance   4. 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   5. HCC surveillance: Overdue- Ordered at [facility]-Pending  6. HAV immune  7. 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 He works at a [employer].    FAMILY HISTORY: There is no family history of hepatocellular carcinoma.     No Known Allergies    Current Outpatient Medications   Medication Sig Dispense Refill   ??? tenofovir alafenamide (VEMLIDY) 25 mg Tab tablet Take 1 tablet by mouth daily with food 30 each 5     No current facility-administered medications for this visit.         PHYSICAL EXAMINATION:   BP 119/73 (BP Site: L Arm, BP Position: Sitting, BP Cuff Size: Medium)  - Pulse 69  - Temp 36.2 ??C (97.1 ??F) (Temporal)  - Resp 18  - Wt 60.9 kg (134 lb 3.2 oz)  - SpO2 98%  - BMI 22.33 kg/m??     GENERAL: This is a thin [place] male in no acute distress.   Pleasant individual in NAD    HEENT: Sclera are anicteric, no temporal muscle loss, oropharynx is negative  NECK: No thyromegaly or lymphadenopathy, No carotid bruits  Abdomen: Soft, non-tender, non-distended, no hepatosplenomegaly, no masses appreciated, no ascites  Skin: No spider angiomata, No rashes  Extremities: Without pedal edema, no palmar erythema  Neuro: Grossly intact, No focal deficits      Results for orders placed or performed in visit on 02/19/20   AFP tumor marker   Result Value Ref Range    AFP-Tumor Marker 1 <8 ng/mL   Comprehensive Metabolic Panel   Result Value Ref Range    Sodium 137 135 - 145 mmol/L    Potassium 3.7 3.5 - 5.1 mmol/L    Chloride 102 98 - 107 mmol/L    Anion Gap 7 3 - 11 mmol/L    CO2 28.2 20.0 - 31.0 mmol/L    BUN 14 9 - 23 mg/dL    Creatinine 2.44 0.10 - 1.10 mg/dL    BUN/Creatinine Ratio 16     EGFR CKD-EPI Non-African American, Male >90 mL/min/1.28m2    EGFR CKD-EPI African American, Male 131 mL/min/1.7m2    Glucose 84 70 - 179 mg/dL    Calcium  10.2 8.7 - 10.4 mg/dL    Albumin 4.7 3.4 - 5.0 g/dL    Total Protein 8.2 5.7 - 8.2 g/dL    Total Bilirubin 0.4 0.3 - 1.2 mg/dL    AST 18 <16 U/L    ALT 22 10 - 49 U/L    Alkaline Phosphatase 76 46 - 116 U/L   CBC   Result Value Ref Range    WBC 9.6 3.5 - 10.5 10*9/L    RBC 6.14 (H) 4.32 - 5.72 10*12/L    HGB 15.6 13.5 - 17.5 g/dL    HCT 10.9 60.4 - 54.0 %    MCV 77.4 (L) 81.0 - 95.0 fL    MCH 25.4 (L) 26.0 - 34.0 pg    MCHC 32.8 30.0 - 36.0 g/dL    RDW 98.1 19.1 - 47.8 %    MPV 8.8 7.0 - 10.0 fL    Platelet 212 150 - 450 10*9/L         Impression:    1.  Chronic hepatitis B: Patient was participant in the [facility] immune active clinical trial.  He was treated with tenofovir.  He was switched to [facility] in June 2019.  He continues on this medication.  He has been overdue for follow-up and HCC surveillance.  I reinforced the importance of ongoing follow-up.    Labs ordered for today.  Liver ultrasound ordered at [facility] radiology.  Patient was instructed to make sure that that he let us know when this was completed via MyChart so that we can verify the results.    2.  Prior Covid infection: Patient did not require hospitalization.  I have recommended that he get the vaccine at some point in the future.    3. 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 Underlying Thalassemia trait    Patient will return for follow-up in 6 months.      Alba Destine, M.D.  Professor of Medicine  Director, [facility]  [place] [facility] [place] [facility] [place]    [phone]

## 2020-02-19 NOTE — Unmapped (Signed)
Chronic hepatitis B. Continue Vemlidy. Labs today. Ultrasound ordered for [facility]. Please make sure the report gets sent to [place] and let us know it was done (Send through [facility]). Return to clinic in 6 months

## 2020-02-21 LAB — HBV DNA QUANT: Hepatitis B virus DNA:PrThr:Pt:Bld:Ord:Probe.amp.tar: NOT DETECTED

## 2020-02-21 LAB — HEPATITIS BE ANTIGEN: Hepatitis B virus little e Ag:PrThr:Pt:Ser/Plas:Ord:IA: NEGATIVE

## 2020-02-21 LAB — HEPATITIS BE ANTIBODY: Hepatitis B virus little e Ab.IgG:PrThr:Pt:Ser:Ord:IA: NEGATIVE

## 2020-03-05 NOTE — Unmapped (Signed)
[facility] Specialty Pharmacy Refill Coordination Note    Specialty Medication(s) to be Shipped:   Infectious Disease: Vemlidy    Other medication(s) to be shipped: n/a     Steven Nelson, DOB: May 14, 1986  Phone: [phone] (home)       All above HIPAA information was verified with patient.     Was a Nurse, learning disability used for this call? No    Completed refill call assessment today to schedule patient's medication shipment from the [facility] [phone]).       Specialty medication(s) and dose(s) confirmed: Regimen is correct and unchanged.   Changes to medications: Shafter reports no changes at this time.  Changes to insurance: No  Questions for the pharmacist: No    Confirmed patient received Welcome Packet with first shipment. The patient will receive a drug information handout for each medication shipped and additional FDA Medication Guides as required.       DISEASE/MEDICATION-SPECIFIC INFORMATION        N/A    SPECIALTY MEDICATION ADHERENCE     Medication Adherence    Patient reported X missed doses in the last month: 0  Specialty Medication: vemlidy 25mg                 vemlidy 25mg   : 10 days of medicine on hand         SHIPPING     Shipping address confirmed in Epic.     Delivery Scheduled: Yes, Expected medication delivery date: 5/4.     Medication will be delivered via UPS to the prescription address in Epic WAM.    [name]   [facility] Pharmacy Specialty Technician

## 2020-03-09 MED FILL — VEMLIDY 25 MG TABLET: 30 days supply | Qty: 30 | Fill #5 | Status: AC

## 2020-03-09 MED FILL — VEMLIDY 25 MG TABLET: 30 days supply | Qty: 30 | Fill #5

## 2020-04-02 DIAGNOSIS — B181 Chronic viral hepatitis B without delta-agent: Principal | ICD-10-CM

## 2020-04-02 MED ORDER — VEMLIDY 25 MG TABLET
ORAL_TABLET | Freq: Every day | ORAL | 3 refills | 90.00000 days | Status: CP
Start: 2020-04-02 — End: 2021-04-02
  Filled 2020-04-08: qty 30, 30d supply, fill #0

## 2020-04-02 NOTE — Unmapped (Signed)
[facility] Pharm[facility] Refill Coordination Note    Specialty Medication(s) to be Shipped:   Infectious Disease: Vemlidy    Other medication(s) to be shipped: n/a     Steven Nelson, DOB: 06-Dec-1985  Phone: [phone] (home)       All above HIPAA information was verified with patient.     Was a Nurse, learning disability used for this call? No    Completed refill call assessment today to schedule patient's medication shipment from the [facility] [phone]).       Specialty medication(s) and dose(s) confirmed: Regimen is correct and unchanged.   Changes to medications: Steven Nelson reports no changes at this time.  Changes to insurance: No  Questions for the pharmacist: No    Confirmed patient received Welcome Packet with first shipment. The patient will receive a drug information handout for each medication shipped and additional FDA Medication Guides as required.       DISEASE/MEDICATION-SPECIFIC INFORMATION        N/A    SPECIALTY MEDICATION ADHERENCE     Medication Adherence    Patient reported X missed doses in the last month: 0  Specialty Medication: vemlidy 25mg                 vemlidy 25mg   : 11 days of medicine on hand         SHIPPING     Shipping address confirmed in Epic.     Delivery Scheduled: Yes, Expected medication delivery date: 6/3.  However, Rx request for refills was sent to the provider as there are none remaining.     Medication will be delivered via UPS to the prescription address in Epic WAM.    [name]   [facility] Specialty Technician

## 2020-04-02 NOTE — Unmapped (Signed)
Reviewed patient's chart. Last clinic appointment and labs done on 02-19-20.

## 2020-04-08 MED FILL — VEMLIDY 25 MG TABLET: 30 days supply | Qty: 30 | Fill #0 | Status: AC

## 2020-05-01 NOTE — Unmapped (Signed)
[facility] Special[facility] Pharm[facility] Refill Coordination Note    Specialty Medication(s) to be Shipped:   Infectious Disease: Vemlidy    Other medication(s) to be shipped:       Ankush Gintz, DOB: 1986/02/07  Phone: [phone] (home)       All above HIPAA information was verified with patient.     Was a Nurse, learning disability used for this call? No    Completed refill call assessment today to schedule patient's medication shipment from the [facility] [phone]).       Specialty medication(s) and dose(s) confirmed: Regimen is correct and unchanged.   Changes to medications: Nettie reports no changes at this time.  Changes to insurance: No  Questions for the pharmacist: No    Confirmed patient received Welcome Packet with first shipment. The patient will receive a drug information handout for each medication shipped and additional FDA Medication Guides as required.       DISEASE/MEDICATION-SPECIFIC INFORMATION        N/A    SPECIALTY MEDICATION ADHERENCE     Medication Adherence    Patient reported X missed doses in the last month: 0  Specialty Medication: vemlidy 25mg   Patient is on additional specialty medications: No  Informant: patient  Reliability of informant: reliable  Patient is at risk for Non-Adherence: No                vemlidy 25 mg: 10 days of medicine on hand         SHIPPING     Shipping address confirmed in Epic.     Delivery Scheduled: Yes, Expected medication delivery date: 06/29.     Medication will be delivered via UPS to the prescription address in Epic WAM.    Steven Nelson   [facility] Specialty Technician

## 2020-05-04 MED FILL — VEMLIDY 25 MG TABLET: ORAL | 30 days supply | Qty: 30 | Fill #1

## 2020-05-04 MED FILL — VEMLIDY 25 MG TABLET: 30 days supply | Qty: 30 | Fill #1 | Status: AC

## 2020-05-26 NOTE — Unmapped (Signed)
[facility] Pharmacy Clinical Assessment & Refill Coordination Note    Steven Nelson, DOB: [birth year]  Phone: [phone] (home)     All above HIPAA information was verified with patient.     Was a Nurse, learning disability used for this call? No    Specialty Medication(s):   Infectious Disease: Vemlidy     Current Outpatient Medications   Medication Sig Dispense Refill   ??? tenofovir alafenamide (VEMLIDY) 25 mg Tab tablet Take 1 tablet (25 mg total) by mouth daily. 90 tablet 3     No current facility-administered medications for this visit.        Changes to medications: [name] reports no changes at this time.    No Known Allergies    Changes to allergies: No    SPECIALTY MEDICATION ADHERENCE     Vemlidy 25 mg: approximately 10 days of medicine on hand     Medication Adherence    Patient reported X missed doses in the last month: 0  Specialty Medication: Vemlidy 25mg   Patient is on additional specialty medications: No  Any gaps in refill history greater than 2 weeks in the last 3 months: no  Demonstrates understanding of importance of adherence: yes  Informant: patient  Provider-estimated medication adherence level: good  Patient is at risk for Non-Adherence: No          Specialty medication(s) dose(s) confirmed: Regimen is correct and unchanged.     Are there any concerns with adherence? No    Adherence counseling provided? Not needed    CLINICAL MANAGEMENT AND INTERVENTION      Clinical Benefit Assessment:    Do you feel the medicine is effective or helping your condition? Yes      HEPATITIS B AND ASSOCIATED LABS:       Lab Results   Component Value Date/Time    HBVDNAQT Not Detected 02/19/2020 08:58 AM    HBVDNAQT Not Detected 12/20/2018 11:40 AM    HBVDNAQT Not Detected 07/24/2018 09:58 AM    HBVDNACP <10 01/21/2016 12:16 PM    HBEAG Negative 02/19/2020 08:58 AM    HBEAG Negative 12/20/2018 11:40 AM    HBEAG Negative 07/24/2018 09:58 AM    HBSAG (A) 12/06/2016 09:50 AM     For final results see Hepatitis B Surface Antigen Neutralization    HBSAG (A) 08/16/2016 10:27 AM     For final results see Hepatitis B Surface Antigen Neutralization    HBSAG (A) 06/28/2016 10:30 AM     For final results see Hepatitis B Surface Antigen Neutralization    HBSAG see below 12/31/2014 11:33 AM    HBSAG see below 02/04/2014 02:00 PM    HBSAG see below 02/28/2013 09:57 AM    HEPBSAB Nonreactive 12/06/2016 09:50 AM    HEPBSAB Nonreactive 11/24/2015 10:22 AM    ALT 22 02/19/2020 08:58 AM    ALT 23 12/20/2018 11:40 AM    ALT 15 (L) 07/24/2018 09:58 AM    ALT 35 09/28/2015 03:20 PM    ALT 51 12/31/2014 11:33 AM    ALT 45 10/14/2014 10:55 AM    AST 18 02/19/2020 08:58 AM    AST 25 09/28/2015 03:20 PM    CREATININE 0.89 02/19/2020 08:58 AM    CREATININE 0.91 12/20/2018 11:40 AM    CREATININE 0.84 07/24/2018 09:58 AM    CREATININE 0.91 12/31/2014 11:33 AM    CREATININE 0.94 10/14/2014 10:55 AM    CREATININE 1.08 07/22/2014 10:55 AM  Clinical Benefit counseling provided? Labs from 02/19/20 show evidence of clinical benefit    Adverse Effects Assessment:    Are you experiencing any side effects? No    Are you experiencing difficulty administering your medicine? No    Quality of Life Assessment:    How many days over the past month did your Hep B   keep you from your normal activities? For example, brushing your teeth or getting up in the morning. 0    Have you discussed this with your provider? Not needed    Therapy Appropriateness:    Is therapy appropriate? Yes, therapy is appropriate and should be continued    DISEASE/MEDICATION-SPECIFIC INFORMATION      N/A    PATIENT SPECIFIC NEEDS     - Does the patient have any physical, cognitive, or cultural barriers? No    - Is the patient high risk? No     - Does the patient require a Care Management Plan? No     - Does the patient require physician intervention or other additional services (i.e. nutrition, smoking cessation, social work)? No      SHIPPING     Specialty Medication(s) to be Shipped:   Infectious Disease: Vemlidy    Other medication(s) to be shipped: n/a     Changes to insurance: No    Delivery Scheduled: Yes, Expected medication delivery date: 05/29/20.     Medication will be delivered via UPS to the confirmed prescription address in [facility].    The patient will receive a drug information handout for each medication shipped and additional FDA Medication Guides as required.  Verified that patient has previously received a Conservation officer, historic buildings.    All of the patient's questions and concerns have been addressed.    Roderic Palau   [facility] Specialty Pharmacist

## 2020-05-28 MED FILL — VEMLIDY 25 MG TABLET: ORAL | 30 days supply | Qty: 30 | Fill #2

## 2020-05-28 MED FILL — VEMLIDY 25 MG TABLET: 30 days supply | Qty: 30 | Fill #2 | Status: AC

## 2020-06-25 NOTE — Unmapped (Signed)
[facility] Special[facility] Pharmacy Refill Coordination Note    Specialty Medication(s) to be Shipped:   Infectious Disease: Vemlidy    Other medication(s) to be shipped: No additional medications requested for fill at this time     Steven Nelson, DOB: Mar 18, 1986  Phone: [phone] (home)       All above HIPAA information was verified with patient.     Was a Nurse, learning disability used for this call? No    Completed refill call assessment today to schedule patient's medication shipment from the [facility] [phone]).       Specialty medication(s) and dose(s) confirmed: Regimen is correct and unchanged.   Changes to medications: Steven Nelson reports no changes at this time.  Changes to insurance: No  Questions for the pharmacist: No    Confirmed patient received Welcome Packet with first shipment. The patient will receive a drug information handout for each medication shipped and additional FDA Medication Guides as required.       DISEASE/MEDICATION-SPECIFIC INFORMATION        N/A    SPECIALTY MEDICATION ADHERENCE     Medication Adherence    Patient reported X missed doses in the last month: 0  Specialty Medication: Vemlidy 25mg   Patient is on additional specialty medications: No                Vemlidy 25 mg: 14 days of medicine on hand         SHIPPING     Shipping address confirmed in Epic.     Delivery Scheduled: Yes, Expected medication delivery date: 07/02/20.     Medication will be delivered via UPS to the prescription address in Epic WAM.    Steven Nelson [facility] Specialty Technician

## 2020-07-01 MED FILL — VEMLIDY 25 MG TABLET: ORAL | 30 days supply | Qty: 30 | Fill #3

## 2020-07-01 MED FILL — VEMLIDY 25 MG TABLET: 30 days supply | Qty: 30 | Fill #3 | Status: AC

## 2020-07-28 NOTE — Unmapped (Signed)
[facility] Pharmacy Refill Coordination Note    Specialty Medication(s) to be Shipped:   Infectious Disease: Vemlidy    Other medication(s) to be shipped: No additional medications requested for fill at this time     Steven Nelson, DOB: 03-Apr-1986  Phone: [phone] (home)       All above HIPAA information was verified with patient.     Was a Nurse, learning disability used for this call? No    Completed refill call assessment today to schedule patient's medication shipment from the [facility] [phone]).       Specialty medication(s) and dose(s) confirmed: Regimen is correct and unchanged.   Changes to medications: Steven Nelson reports no changes at this time.  Changes to insurance: No  Questions for the pharmacist: No    Confirmed patient received Welcome Packet with first shipment. The patient will receive a drug information handout for each medication shipped and additional FDA Medication Guides as required.       DISEASE/MEDICATION-SPECIFIC INFORMATION        N/A    SPECIALTY MEDICATION ADHERENCE     Medication Adherence    Patient reported X missed doses in the last month: 0  Specialty Medication: vemlidy 25mg                 vemlidy 25mg   : 10 days of medicine on hand         SHIPPING     Shipping address confirmed in Epic.     Delivery Scheduled: Yes, Expected medication delivery date: 9/23.     Medication will be delivered via UPS to the prescription address in Epic WAM.    [name]   [facility] Specialty Technician

## 2020-07-29 MED FILL — VEMLIDY 25 MG TABLET: 30 days supply | Qty: 30 | Fill #4 | Status: AC

## 2020-07-29 MED FILL — VEMLIDY 25 MG TABLET: ORAL | 30 days supply | Qty: 30 | Fill #4

## 2020-08-03 ENCOUNTER — Other Ambulatory Visit: Payer: Self-pay

## 2020-08-03 ENCOUNTER — Emergency Department (HOSPITAL_BASED_OUTPATIENT_CLINIC_OR_DEPARTMENT_OTHER)
Admission: EM | Admit: 2020-08-03 | Discharge: 2020-08-03 | Disposition: A | Discharge status: Home / Self Care | Payer: Self-pay | Attending: Emergency Medicine | Admitting: Emergency Medicine

## 2020-08-03 ENCOUNTER — Encounter (HOSPITAL_BASED_OUTPATIENT_CLINIC_OR_DEPARTMENT_OTHER): Payer: Self-pay | Admitting: *Deleted

## 2020-08-03 DIAGNOSIS — F17200 Nicotine dependence, unspecified, uncomplicated: Secondary | ICD-10-CM | POA: Insufficient documentation

## 2020-08-03 DIAGNOSIS — R0781 Pleurodynia: Secondary | ICD-10-CM | POA: Insufficient documentation

## 2020-08-03 LAB — TROPONIN I (HIGH SENSITIVITY): Troponin I (High Sensitivity): <2 ng/L (ref <18)

## 2020-08-03 LAB — D-DIMER, QUANTITATIVE: D-Dimer, Quant: <0.27 ug/mL-FEU (ref 0.00–0.50)

## 2020-08-03 MED ORDER — IBUPROFEN 800 MG PO TABS: 800.0000 mg | ORAL_TABLET | Freq: Three times a day (TID) | ORAL | 0 refills | Status: AC | PRN

## 2020-08-03 NOTE — Discharge Instructions (Signed)
You were seen in the emergency department today with chest discomfort.  Your lab work today was reassuring.  This could be inflammation along the Neal Trulson lining or strain in the muscles of your chest wall.  Please take the ibuprofen as directed for pain.  He may also take Tylenol which is over-the-counter or use Voltaren gel which is also now over-the-counter.  Please establish care with a primary care doctor.  If you do not have one I have provided the contact information for one on this paperwork.  Return to the emergency department any new or suddenly worsening symptoms.

## 2020-08-03 NOTE — ED Provider Notes (Signed)
Emergency Department Provider Note   I have reviewed the triage vital signs and the nursing notes.   HISTORY  Chief Complaint chest wall pain   HPI Dillon Estrada is a 34 y.o. male with past medical history reviewed below presents emergency department with right-sided chest pain which is been constant now over the past 8 to 9 days.  He describes mainly right-sided chest discomfort with no clear provoking factors.  He was seen at the [facility] urgent care yesterday and had chemistry along with CBC which were normal.  His chest x-ray at that time was also normal.  He states that he was prescribed medicine for ulcers but continues to have pain.  He describes it as sharp at times with underlying pressure type sensation.  No improvement or worsening with food.  No fevers or chills.  He denies shortness of breath.  No pleuritic pain.  No recent travel.  No pain or swelling in the legs.    Past Medical History:  Diagnosis Date  . Hepatitis B     There are no problems to display for this patient.   History reviewed. No pertinent surgical history.  Allergies Patient has no known allergies.  No family history on file.  Social History Social History   Tobacco Use  . Smoking status: Current Every Day Smoker  . Smokeless tobacco: Never Used  Substance Use Topics  . Alcohol use: Yes  . Drug use: Yes    Types: Marijuana    Review of Systems  Constitutional: No fever/chills Eyes: No visual changes. ENT: No sore throat. Cardiovascular: Positive chest pain. Respiratory: Denies shortness of breath. Gastrointestinal: No abdominal pain.  No nausea, no vomiting.  No diarrhea.  No constipation. Genitourinary: Negative for dysuria. Musculoskeletal: Negative for back pain. Skin: Negative for rash. Neurological: Negative for headaches, focal weakness or numbness.  10-point ROS otherwise negative.  ____________________________________________   PHYSICAL EXAM:  VITAL  SIGNS: ED Triage Vitals  Enc Vitals Group     BP 08/03/20 1333 (!) 143/100     Pulse Rate 08/03/20 1333 99     Resp 08/03/20 1333 18     Temp 08/03/20 1333 98.8 F (37.1 C)     Temp Source 08/03/20 1333 Oral     SpO2 08/03/20 1333 100 %     Weight 08/03/20 1332 140 lb (63.5 kg)     Height 08/03/20 1332 5\' 6"  (1.676 m)   Constitutional: Alert and oriented. Well appearing and in no acute distress. Eyes: Conjunctivae are normal.  Head: Atraumatic. Nose: No congestion/rhinnorhea. Mouth/Throat: Mucous membranes are moist.   Neck: No stridor.   Cardiovascular: Normal rate, regular rhythm. Good peripheral circulation. Grossly normal heart sounds.   Respiratory: Normal respiratory effort.  No retractions. Lungs CTAB. Gastrointestinal: Soft and nontender. No focal RUQ tenderness. No distention.  Musculoskeletal: No lower extremity tenderness nor edema. No gross deformities of extremities. Neurologic:  Normal speech and language.  Skin:  Skin is warm, dry and intact. No rash noted.   ____________________________________________   LABS (all labs ordered are listed, but only abnormal results are displayed)  Labs Reviewed  D-DIMER, QUANTITATIVE (NOT AT [facility])  TROPONIN I (HIGH SENSITIVITY)  TROPONIN I (HIGH SENSITIVITY)   ____________________________________________  EKG   EKG Interpretation  Date/Time:  Monday August 03 2020 14:48:05 EDT Ventricular Rate:  81 PR Interval:    QRS Duration: 91 QT Interval:  373 QTC Calculation: 433 R Axis:   95 Text Interpretation: Sinus rhythm Borderline  right axis deviation ST elev, probable normal early repol pattern No old tracing for comparison. No STEMI Confirmed by Alona Bene [phone]) on 08/03/2020 2:59:25 PM       ____________________________________________  RADIOLOGY  Reviewed CXR from [facility]  ____________________________________________   PROCEDURES  Procedure(s) performed:   Procedures  None   ____________________________________________   INITIAL IMPRESSION / ASSESSMENT AND PLAN / ED COURSE  Pertinent labs & imaging results that were available during my care of the patient were reviewed by me and considered in my medical decision making (see chart for details).   Patient presents emergency department with right-sided chest pain which is fairly atypical for ACS.  Patient has low risk for ACS overall.  Also low risk for PE.  He does have occasional sinus tachycardia here in the emergency department but overall vitals are WNL.  Reviewed the work-up from urgent care yesterday including a CMP, CBC, chest x-ray which were unremarkable.  EKG here shows nonspecific ST change likely early repolarization with EKG interpreted by me as above.  Do not plan for repeat chest imaging.  Will add on a D-dimer and troponin is either not performed yesterday. Cannot r/o with PERC rule as patient has been tachy here although not sustained.   Care transferred to Dr. Clarene Duke pending d-dimer and troponin results.  ____________________________________________  FINAL CLINICAL IMPRESSION(S) / ED DIAGNOSES  Final diagnoses:  Pleurodynia    NEW OUTPATIENT MEDICATIONS STARTED DURING THIS VISIT:  Discharge Medication List as of 08/03/2020  4:07 PM    START taking these medications   Details  ibuprofen (ADVIL) 800 MG tablet Take 1 tablet (800 mg total) by mouth every 8 (eight) hours as needed for moderate pain., Starting Mon 08/03/2020, Normal        Note:  This document was prepared using Dragon voice recognition software and may include unintentional dictation errors.  Alona Bene, MD, [facility]    Thais Silberstein, Arlyss Repress, MD 08/04/20 0800

## 2020-08-03 NOTE — ED Provider Notes (Signed)
I received this patient in signout from Dr. Jacqulyn Bath.  Briefly, who presented with chest wall pain and had been recently evaluated for the same with normal basic lab work and chest x-ray.  EKG reassuring.  At time of signout awaiting D-dimer and single troponin.  D-dimer and troponin normal.  Symptoms very atypical for ACS and given pain has been ongoing for 8 days with reassuring work-up I feel that he is safe for discharge home.  I have discussed supportive measures for his symptoms and reviewed return precautions.  He voiced understanding.   Nicie Milan, Ambrose Finland, MD 08/03/20 [phone]

## 2020-08-03 NOTE — ED Notes (Signed)
States been having intermittent sharp pains at rt ant chest. Denies fevers, cough, sob, appetite is still good

## 2020-08-03 NOTE — ED Triage Notes (Addendum)
Pt c/o right sided chest wall pain x 1 week , seen by UC yesterday for same , ekg and chest xray, labs  normal

## 2020-08-03 NOTE — ED Notes (Signed)
Pt on monitor 

## 2020-08-27 NOTE — Unmapped (Signed)
[facility] Special[facility] Pharmacy Refill Coordination Note    Specialty Medication(s) to be Shipped:   Infectious Disease: Vemlidy    Other medication(s) to be shipped: No additional medications requested for fill at this time     Steven Nelson, DOB: [birth year]  Phone: [phone] (home)       All above HIPAA information was verified with patient.     Was a Nurse, learning disability used for this call? No    Completed refill call assessment today to schedule patient's medication shipment from the [facility] [phone]).       Specialty medication(s) and dose(s) confirmed: Regimen is correct and unchanged.   Changes to medications: Steven Nelson reports no changes at this time.  Changes to insurance: No  Questions for the pharmacist: No    Confirmed patient received Welcome Packet with first shipment. The patient will receive a drug information handout for each medication shipped and additional FDA Medication Guides as required.       DISEASE/MEDICATION-SPECIFIC INFORMATION        N/A    SPECIALTY MEDICATION ADHERENCE     Medication Adherence    Patient reported X missed doses in the last month: 0  Specialty Medication: vemlidy 25mg   Patient is on additional specialty medications: No  Patient is on more than two specialty medications: No  Any gaps in refill history greater than 2 weeks in the last 3 months: no  Demonstrates understanding of importance of adherence: yes  Informant: patient  Reliability of informant: reliable  Provider-estimated medication adherence level: good  Patient is at risk for Non-Adherence: No                vemlidy 25 mg: 14 days of medicine on hand         SHIPPING     Shipping address confirmed in Epic.     Delivery Scheduled: Yes, Expected medication delivery date: 10/28.     Medication will be delivered via UPS to the prescription address in Epic WAM.    Steven Nelson   [facility] Specialty Technician

## 2020-09-02 MED FILL — VEMLIDY 25 MG TABLET: ORAL | 30 days supply | Qty: 30 | Fill #5

## 2020-09-02 MED FILL — VEMLIDY 25 MG TABLET: 30 days supply | Qty: 30 | Fill #5 | Status: AC

## 2020-09-24 NOTE — Unmapped (Signed)
[facility] Special[facility] Pharmacy Refill Coordination Note    Specialty Medication(s) to be Shipped:   Infectious Disease: Vemlidy    Other medication(s) to be shipped: No additional medications requested for fill at this time     Keldon Lassen, DOB: Jun 06, 1986  Phone: [phone] (home)       All above HIPAA information was verified with patient.     Was a Nurse, learning disability used for this call? No    Completed refill call assessment today to schedule patient's medication shipment from the [facility] [phone]).       Specialty medication(s) and dose(s) confirmed: Regimen is correct and unchanged.   Changes to medications: Tiyon reports no changes at this time.  Changes to insurance: No  Questions for the pharmacist: No    Confirmed patient received Welcome Packet with first shipment. The patient will receive a drug information handout for each medication shipped and additional FDA Medication Guides as required.       DISEASE/MEDICATION-SPECIFIC INFORMATION        N/A    SPECIALTY MEDICATION ADHERENCE     Medication Adherence    Patient reported X missed doses in the last month: 1  Specialty Medication: VEMLIDY 25MG    Patient is on additional specialty medications: No  Informant: patient  Confirmed plan for next specialty medication refill: delivery by pharmacy  Refills needed for supportive medications: not needed          Refill Coordination    Has the Patients' Contact Information Changed: No  Is the Shipping Address Different: No           VEMLIDY 25 mg: 8 days of medicine on hand         SHIPPING     Shipping address confirmed in Epic.     Delivery Scheduled: Yes, Expected medication delivery date: 11/24.     Medication will be delivered via UPS to the prescription address in Epic WAM.    Jolene Schimke   [facility] Specialty Technician

## 2020-09-29 MED FILL — VEMLIDY 25 MG TABLET: 30 days supply | Qty: 30 | Fill #6 | Status: AC

## 2020-09-29 MED FILL — VEMLIDY 25 MG TABLET: ORAL | 30 days supply | Qty: 30 | Fill #6

## 2020-09-30 DIAGNOSIS — B181 Chronic viral hepatitis B without delta-agent: Principal | ICD-10-CM

## 2020-10-21 ENCOUNTER — Ambulatory Visit: Admit: 2020-10-21 | Discharge: 2020-10-21 | Attending: Gastroenterology | Primary: Gastroenterology

## 2020-10-21 ENCOUNTER — Ambulatory Visit: Admit: 2020-10-21 | Discharge: 2020-10-21

## 2020-10-21 DIAGNOSIS — B181 Chronic viral hepatitis B without delta-agent: Principal | ICD-10-CM

## 2020-10-21 LAB — COMPREHENSIVE METABOLIC PANEL
ALBUMIN: 4.7 g/dL (ref 3.4–5.0)
ALKALINE PHOSPHATASE: 78 U/L (ref 46–116)
ALT (SGPT): 21 U/L (ref 10–49)
ANION GAP: 12 mmol/L (ref 5–14)
AST (SGOT): 20 U/L (ref <=34)
BILIRUBIN TOTAL: 0.7 mg/dL (ref 0.3–1.2)
BLOOD UREA NITROGEN: 14 mg/dL (ref 9–23)
BUN / CREAT RATIO: 15
CALCIUM: 11.1 mg/dL — ABNORMAL HIGH (ref 8.7–10.4)
CHLORIDE: 100 mmol/L (ref 98–107)
CO2: 27.1 mmol/L (ref 20.0–31.0)
CREATININE: 0.94 mg/dL
EGFR CKD-EPI AA MALE: >90 mL/min/{1.73_m2} (ref >=60)
EGFR CKD-EPI NON-AA MALE: >90 mL/min/{1.73_m2} (ref >=60)
GLUCOSE RANDOM: 115 mg/dL (ref 70–179)
POTASSIUM: 3.7 mmol/L (ref 3.4–4.5)
PROTEIN TOTAL: 8.3 g/dL — ABNORMAL HIGH (ref 5.7–8.2)
SODIUM: 139 mmol/L (ref 135–145)

## 2020-10-21 LAB — CBC
HEMATOCRIT: 51.8 % — ABNORMAL HIGH (ref 38.0–50.0)
HEMOGLOBIN: 16.9 g/dL (ref 13.5–17.5)
MEAN CORPUSCULAR HEMOGLOBIN CONC: 32.6 g/dL (ref 30.0–36.0)
MEAN CORPUSCULAR HEMOGLOBIN: 25.3 pg — ABNORMAL LOW (ref 26.0–34.0)
MEAN CORPUSCULAR VOLUME: 77.7 fL — ABNORMAL LOW (ref 81.0–95.0)
MEAN PLATELET VOLUME: 8.8 fL (ref 7.0–10.0)
PLATELET COUNT: 202 10*9/L (ref 150–450)
RED BLOOD CELL COUNT: 6.67 10*12/L — ABNORMAL HIGH (ref 4.32–5.72)
RED CELL DISTRIBUTION WIDTH: 14.7 % (ref 12.0–15.0)
WBC ADJUSTED: 6.9 10*9/L (ref 3.5–10.5)

## 2020-10-21 LAB — HEPATITIS B SURFACE ANTIGEN

## 2020-10-21 LAB — HEP.B NEUTRALIZATION: HBSAG NEUT: POSITIVE — AB

## 2020-10-21 LAB — AFP TUMOR MARKER: AFP-TUMOR MARKER: <2 ng/mL (ref <=8)

## 2020-10-21 NOTE — Unmapped (Signed)
[facility]      [phone]      Name:Steven Nelson   NGEX:[id number]   Age:34 y.o.   Sex:M   Race:Asian       CHIEF COMPLAINT: Study followup after completing HBRN IA protocol. Last visit in 12/2018.     HISTORY OF PRESENT ILLNESS: Steven Nelson is a 34 y.o.  [place] with chronic HBV originally HBeAg + disease. He was in HBV cohort study then transitioned to treatment. His first PEG injection 03/30/12 for immune active treatment trial, randomized PEG and tenofovir PEG reduced to 135 mcg for side effects including weight loss. He completed PEG at week 24. He has continued per protocol on tenofovir alone. He has persistently undetectable HBV DNA although his Hep Be Ag status fluctuates from positive to negative at various times.      Interval history: Patient feels well. Had Covid in 202 but has not yet been vaccinated. No lingering symptoms. Denies N/V abd pain, chest pain, or SOB. Working full time. 100% adherent to [facility] (switched from tenofovir on June 2019).     PAST MEDICAL HISTORY:   1. Chronic hepatitis B, genotype C.   2. Dyslipidemia.   3. Lactose intolerance   4. 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   5. HCC surveillance: Overdue- Ordered at [facility]-Pending  6. HAV immune  7. 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 He works at a [employer]. Does not drink or smoke.    FAMILY HISTORY: There is no family history of hepatocellular carcinoma.     No Known Allergies    Current Outpatient Medications   Medication Sig Dispense Refill   ??? tenofovir alafenamide (VEMLIDY) 25 mg Tab tablet Take 1 tablet (25 mg total) by mouth daily. 90 tablet 3     No current facility-administered medications for this visit.         PHYSICAL EXAMINATION:   BP 116/70  - Pulse 70  - Temp 36.4 ??C (97.6 ??F) (Temporal)  - Resp 16  - Wt 62.2 kg (137 lb 3.2 oz)  - SpO2 98%  - BMI 22.83 kg/m??         GENERAL: This is a thin [place] male in no acute distress. Pleasant individual in NAD  HEENT: Sclera are anicteric, no temporal muscle loss  NECK: No thyromegaly or lymphadenopathy, No carotid bruits  Abdomen: Soft, non-tender, non-distended, no hepatosplenomegaly, no masses appreciated, no ascites  Skin: No spider angiomata, No rashes  Extremities: Without pedal edema, no palmar erythema  Neuro: Grossly intact, No focal deficits      Results for orders placed or performed in visit on 10/21/20   Hepatitis B Surface Antigen   Result Value Ref Range    Hep B Surface Ag (A) Nonreactive     For final results see Hepatitis B Surface Antigen Neutralization   Hepatitis B DNA, Quantitative, PCR   Result Value Ref Range    HBV DNA Quant Not Detected Not Detected   Hepatitis B e Antibody   Result Value Ref Range    Hep B E Ab Negative Negative   Hepatitis B e Antigen   Result Value Ref Range    Hep B E Ag Negative Negative   AFP tumor marker   Result Value Ref Range    AFP-Tumor Marker <2 <=8 ng/mL   Comprehensive Metabolic Panel   Result Value Ref Range    Sodium 139 135 -  145 mmol/L    Potassium 3.7 3.4 - 4.5 mmol/L    Chloride 100 98 - 107 mmol/L    Anion Gap 12 5 - 14 mmol/L    CO2 27.1 20.0 - 31.0 mmol/L    BUN 14 9 - 23 mg/dL    Creatinine 2.95 6.21 - 1.10 mg/dL    BUN/Creatinine Ratio 15     EGFR CKD-EPI Non-African American, Male >90 >=60 mL/min/1.49m2    EGFR CKD-EPI African American, Male >90 >=60 mL/min/1.59m2    Glucose 115 70 - 179 mg/dL    Calcium 30.8 (H) 8.7 - 10.4 mg/dL    Albumin 4.7 3.4 - 5.0 g/dL    Total Protein 8.3 (H) 5.7 - 8.2 g/dL    Total Bilirubin 0.7 0.3 - 1.2 mg/dL    AST 20 <=65 U/L    ALT 21 10 - 49 U/L    Alkaline Phosphatase 78 46 - 116 U/L   CBC   Result Value Ref Range    WBC 6.9 3.5 - 10.5 10*9/L    RBC 6.67 (H) 4.32 - 5.72 10*12/L    HGB 16.9 13.5 - 17.5 g/dL    HCT 78.4 (H) 69.6 - 50.0 %    MCV 77.7 (L) 81.0 - 95.0 fL    MCH 25.3 (L) 26.0 - 34.0 pg    MCHC 32.6 30.0 - 36.0 g/dL    RDW 29.5 28.4 - 13.2 %    MPV 8.8 7.0 - 10.0 fL    Platelet 202 150 - 450 10*9/L   HEP.B NEUTRALIZATION   Result Value Ref Range    Hep B S Ag Neutralization (A) Reactive by screening assay;non-confirmable by neutralization assay     Hepatitis B Surface Antigen POSITIVE;confirmed by neutralization   ABN HB CONFIRM   Result Value Ref Range    Abnormal Hemoglobin Confirm       Hemoglobin/Thalassemia profile results confirmed by IEF.   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            Impression:    1.  Chronic hepatitis B: Patient was participant in the [facility] immune active clinical trial.  He was treated with tenofovir.  He was switched to [facility] in June 2019.  He continues on this medication.  He has been overdue for follow-up and HCC surveillance.  I reinforced the importance of ongoing follow-up.    Labs ordered for today.  Liver ultrasound ordered at [place] [facility].  Patient was instructed to make sure that that he let us know when this was completed via MyChart so that we can verify the results.    2.  Prior Covid infection: Patient did not require hospitalization. COVID #1 vaccine today    3. 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 ? Underlying Thalassemia trait    Patient will return for follow-up in 6 months.      Alba Destine, M.D.  Professor of Medicine  Director, [facility]  [place] [facility] [place]    [phone]

## 2020-10-21 NOTE — Unmapped (Addendum)
Chronic hepatitis B. Doing well on Vemlidy. Liver ultrasound ordered for [facility]. Covid vaccination #1 today. Second and booster can be done locally near your home. Need flu shot also. Return to office in 6 months.

## 2020-10-23 LAB — HEPATITIS B E ANTIBODY: HEPATITIS BE ANTIBODY: NEGATIVE

## 2020-10-23 LAB — ABN HB CONFIRM

## 2020-10-23 LAB — HEPATITIS B E ANTIGEN: HEPATITIS BE ANTIGEN: NEGATIVE

## 2020-10-23 NOTE — Unmapped (Signed)
[facility] Clinical Assessment & Refill Coordination Note    Steven Nelson, DOB: 1986-10-26  Phone: [phone] (home)     All above HIPAA information was verified with patient.     Was a Nurse, learning disability used for this call? No    Specialty Medication(s):   Infectious Disease: Vemlidy     Current Outpatient Medications   Medication Sig Dispense Refill   ??? tenofovir alafenamide (VEMLIDY) 25 mg Tab tablet Take 1 tablet (25 mg total) by mouth daily. 90 tablet 3     No current facility-administered medications for this visit.        Changes to medications: [name] reports no changes at this time.    No Known Allergies    Changes to allergies: No    SPECIALTY MEDICATION ADHERENCE     Vemlidy 25 mg: approximately 21 days of medicine on hand     Medication Adherence    Patient reported X missed doses in the last month: 0  Specialty Medication: Vemlidy 25mg   Patient is on additional specialty medications: No  Any gaps in refill history greater than 2 weeks in the last 3 months: no  Demonstrates understanding of importance of adherence: yes  Informant: patient  Provider-estimated medication adherence level: good  Patient is at risk for Non-Adherence: No          Specialty medication(s) dose(s) confirmed: Regimen is correct and unchanged.     Are there any concerns with adherence? No    Adherence counseling provided? Not needed    CLINICAL MANAGEMENT AND INTERVENTION      Clinical Benefit Assessment:    Do you feel the medicine is effective or helping your condition? Yes      HEPATITIS B AND ASSOCIATED LABS:       Lab Results   Component Value Date/Time    HBVDNAQT Not Detected 02/19/2020 08:58 AM    HBVDNAQT Not Detected 12/20/2018 11:40 AM    HBVDNAQT Not Detected 07/24/2018 09:58 AM    HBVDNACP <10 01/21/2016 12:16 PM    HBEAG Negative 02/19/2020 08:58 AM    HBEAG Negative 12/20/2018 11:40 AM    HBEAG Negative 07/24/2018 09:58 AM    HBSAG (A) 10/21/2020 09:52 AM     For final results see Hepatitis B Surface Antigen Neutralization    HBSAG (A) 12/06/2016 09:50 AM     For final results see Hepatitis B Surface Antigen Neutralization    HBSAG (A) 08/16/2016 10:27 AM     For final results see Hepatitis B Surface Antigen Neutralization    HBSAG see below 12/31/2014 11:33 AM    HBSAG see below 02/04/2014 02:00 PM    HBSAG see below 02/28/2013 09:57 AM    HEPBSAB Nonreactive 12/06/2016 09:50 AM    HEPBSAB Nonreactive 11/24/2015 10:22 AM    ALT 21 10/21/2020 09:52 AM    ALT 22 02/19/2020 08:58 AM    ALT 23 12/20/2018 11:40 AM    ALT 35 09/28/2015 03:20 PM    ALT 51 12/31/2014 11:33 AM    ALT 45 10/14/2014 10:55 AM    AST 20 10/21/2020 09:52 AM    AST 25 09/28/2015 03:20 PM    CREATININE 0.94 10/21/2020 09:52 AM    CREATININE 0.89 02/19/2020 08:58 AM    CREATININE 0.91 12/20/2018 11:40 AM    CREATININE 0.91 12/31/2014 11:33 AM    CREATININE 0.94 10/14/2014 10:55 AM    CREATININE 1.08 07/22/2014 10:55 AM       Clinical  Benefit counseling provided? Labs from 02/19/20 show evidence of clinical benefit    Adverse Effects Assessment:    Are you experiencing any side effects? No    Are you experiencing difficulty administering your medicine? No    Quality of Life Assessment:    How many days over the past month did your Hepatitis B  keep you from your normal activities? For example, brushing your teeth or getting up in the morning. 0    Have you discussed this with your provider? Not needed    Therapy Appropriateness:    Is therapy appropriate? Yes, therapy is appropriate and should be continued    DISEASE/MEDICATION-SPECIFIC INFORMATION      N/A    PATIENT SPECIFIC NEEDS     - Does the patient have any physical, cognitive, or cultural barriers? No    - Is the patient high risk? No    - Does the patient require a Care Management Plan? No     - Does the patient require physician intervention or other additional services (i.e. nutrition, smoking cessation, social work)? No      SHIPPING     Specialty Medication(s) to be Shipped:   Infectious Disease: Vemlidy    Other medication(s) to be shipped: No additional medications requested for fill at this time     Changes to insurance: No    Delivery Scheduled: Yes, Expected medication delivery date: 10/29/20.     Medication will be delivered via UPS to the confirmed prescription' address in [place].    The patient will receive a drug information handout for each medication shipped and additional FDA Medication Guides as required.  Verified that patient has previously received a Conservation officer, historic buildings.    All of the patient's questions and concerns have been addressed.    Roderic Palau   [facility] Specialty Pharmacist

## 2020-10-28 MED FILL — VEMLIDY 25 MG TABLET: 30 days supply | Qty: 30 | Fill #7 | Status: AC

## 2020-10-28 MED FILL — VEMLIDY 25 MG TABLET: ORAL | 30 days supply | Qty: 30 | Fill #7

## 2020-10-30 LAB — HEPATITIS B DNA, QUANTITATIVE, PCR: HBV DNA QUANT: NOT DETECTED

## 2020-11-26 NOTE — Unmapped (Signed)
[facility] Special[facility] Pharmacy Refill Coordination Note    Specialty Medication(s) to be Shipped:   Infectious Disease: Vemlidy    Other medication(s) to be shipped: No additional medications requested for fill at this time     Steven Nelson, DOB: 29-Aug-1986  Phone: [phone] (home)       All above HIPAA information was verified with patient.     Was a Nurse, learning disability used for this call? No    Completed refill call assessment today to schedule patient's medication shipment from the [facility] [phone]).       Specialty medication(s) and dose(s) confirmed: Regimen is correct and unchanged.   Changes to medications: Steven Nelson reports no changes at this time.  Changes to insurance: No  Questions for the pharmacist: No    Confirmed patient received Welcome Packet with first shipment. The patient will receive a drug information handout for each medication shipped and additional FDA Medication Guides as required.       DISEASE/MEDICATION-SPECIFIC INFORMATION        N/A    SPECIALTY MEDICATION ADHERENCE     Medication Adherence    Patient reported X missed doses in the last month: 0  Specialty Medication: vemlidy                vemlidy 25mg   : 18 days of medicine on hand         SHIPPING     Shipping address confirmed in Epic.     Delivery Scheduled: Yes, Expected medication delivery date: 1/26.     Medication will be delivered via UPS to the prescription address in Epic WAM.    Steven Gambles   [facility] Specialty Technician

## 2020-12-01 DIAGNOSIS — B181 Chronic viral hepatitis B without delta-agent: Principal | ICD-10-CM

## 2020-12-01 MED FILL — VEMLIDY 25 MG TABLET: ORAL | 30 days supply | Qty: 30 | Fill #8

## 2020-12-25 NOTE — Unmapped (Signed)
[facility] Specialty Pharmacy Refill Coordination Note    Specialty Medication(s) to be Shipped:   Infectious Disease: Vemlidy    Other medication(s) to be shipped: No additional medications requested for fill at this time     Steven Nelson, DOB: May 07, 1986  Phone: [phone] (home)       All above HIPAA information was verified with patient.     Was a Nurse, learning disability used for this call? No    Completed refill call assessment today to schedule patient's medication shipment from the [facility] [phone]).       Specialty medication(s) and dose(s) confirmed: Regimen is correct and unchanged.   Changes to medications: Steven Nelson reports no changes at this time.  Changes to insurance: No  Questions for the pharmacist: No    Confirmed patient received Welcome Packet with first shipment. The patient will receive a drug information handout for each medication shipped and additional FDA Medication Guides as required.       DISEASE/MEDICATION-SPECIFIC INFORMATION        N/A    SPECIALTY MEDICATION ADHERENCE     Medication Adherence    Patient reported X missed doses in the last month: 0  Specialty Medication: vemlidy 25mg                 vemlidy 25mg   : 14 days of medicine on hand         SHIPPING     Shipping address confirmed in Epic.     Delivery Scheduled: Yes, Expected medication delivery date: 2/23.     Medication will be delivered via UPS to the prescription address in Epic WAM.    Steven Nelson   [facility] Specialty Technician

## 2020-12-29 MED FILL — VEMLIDY 25 MG TABLET: ORAL | 30 days supply | Qty: 30 | Fill #9

## 2021-01-25 NOTE — Unmapped (Signed)
[facility] has made a second and final attempt to reach this patient to refill the following medication: vemlidy.      We have left voicemails on the following phone numbers: [phone] and have sent a MyChart message.    Dates contacted: 3/18, 3/21  Last scheduled delivery: shipped 2/22    The patient may be at risk of non-compliance with this medication. The patient should call the [facility] at [phone] (option 4) to refill medication.    Steven Nelson   [facility] Specialty Technician

## 2021-01-26 NOTE — Unmapped (Signed)
Refill coordination for [facility] has made multiple attempts to reach pt to coordinate refill of Vemlidy to prevent any gaps in treatment. Reached pt and transferred call to [facility] for pt to arrange refill.    Reminded pt of his next appt with Dr. Foy Guadalajara on 05/05/21. Pt report he was to complete ultrasound at [facility] but haven't received a call to schedule. Asked pt to go ahead and call them. Will also ask Lanora Manis, RN to follow up.    Park Breed, Pharm D., BCPS, BCGP, CPP  [facility]  [address]  [place], [place] [address]  [phone]

## 2021-01-26 NOTE — Unmapped (Signed)
[facility] Specialty Pharmacy Refill Coordination Note    Specialty Medication(s) to be Shipped:   Infectious Disease: Vemlidy    Other medication(s) to be shipped: No additional medications requested for fill at this time     Steven Nelson, DOB: [birth year]  Phone: [phone] (home)       All above HIPAA information was verified with patient.     Was a Nurse, learning disability used for this call? No    Completed refill call assessment today to schedule patient's medication shipment from the [facility] [phone]).       Specialty medication(s) and dose(s) confirmed: Regimen is correct and unchanged.   Changes to medications: vitamin D3, Mg, and K2  Changes to insurance: No  Questions for the pharmacist: No    Confirmed patient received a Conservation officer, historic buildings and a Surveyor, mining with first shipment. The patient will receive a drug information handout for each medication shipped and additional FDA Medication Guides as required.       DISEASE/MEDICATION-SPECIFIC INFORMATION        N/A    SPECIALTY MEDICATION ADHERENCE     Medication Adherence    Patient reported X missed doses in the last month: 0  Specialty Medication: Vemlidy 25mg   Patient is on additional specialty medications: No  Any gaps in refill history greater than 2 weeks in the last 3 months: no  Demonstrates understanding of importance of adherence: yes  Informant: patient  Provider-estimated medication adherence level: good  Patient is at risk for Non-Adherence: No            Vemlidy 25 mg: approximately 14 days of medicine on hand         SHIPPING     Shipping address confirmed in Epic.     Delivery Scheduled: Yes, Expected medication delivery date: 02/02/21.     Medication will be delivered via UPS to the prescription address in Epic WAM.    Roderic Palau   [facility] Specialty Pharmacist

## 2021-02-01 ENCOUNTER — Other Ambulatory Visit: Payer: Self-pay | Admitting: Gastroenterology

## 2021-02-01 DIAGNOSIS — B181 Chronic viral hepatitis B without delta-agent: Secondary | ICD-10-CM

## 2021-02-01 MED FILL — VEMLIDY 25 MG TABLET: ORAL | 30 days supply | Qty: 30 | Fill #10

## 2021-02-22 ENCOUNTER — Ambulatory Visit
Admission: RE | Admit: 2021-02-22 | Discharge: 2021-02-22 | Disposition: A | Discharge status: Home / Self Care | Payer: BC Managed Care – PPO | Source: Ambulatory Visit | Attending: Gastroenterology | Admitting: Gastroenterology

## 2021-02-22 ENCOUNTER — Other Ambulatory Visit: Payer: Self-pay

## 2021-02-22 DIAGNOSIS — B181 Chronic viral hepatitis B without delta-agent: Secondary | ICD-10-CM

## 2021-03-04 NOTE — Unmapped (Signed)
[facility] Special[facility] Pharmacy Refill Coordination Note    Specialty Medication(s) to be Shipped:   Infectious Disease: Vemlidy    Other medication(s) to be shipped: No additional medications requested for fill at this time     Steven Nelson, DOB: 10-May-1986  Phone: [phone] (home)       All above HIPAA information was verified with patient.     Was a Nurse, learning disability used for this call? No    Completed refill call assessment today to schedule patient's medication shipment from the [facility] [phone]).  All relevant notes have been reviewed.     Specialty medication(s) and dose(s) confirmed: Regimen is correct and unchanged.   Changes to medications: [name] reports no changes at this time.  Changes to insurance: No  New side effects reported not previously addressed with a pharmacist or physician: None reported  Questions for the pharmacist: No    Confirmed patient received a Conservation officer, historic buildings and a Surveyor, mining with first shipment. The patient will receive a drug information handout for each medication shipped and additional FDA Medication Guides as required.       DISEASE/MEDICATION-SPECIFIC INFORMATION        N/A    SPECIALTY MEDICATION ADHERENCE     Medication Adherence    Patient reported X missed doses in the last month: 1  Specialty Medication: Vemlidy  Patient is on additional specialty medications: No  Patient is on more than two specialty medications: No              Were doses missed due to medication being on hold? No    Vemlidy 25 mg: 14 days of medicine on hand       REFERRAL TO PHARMACIST     Referral to the pharmacist: Not needed      [facility]     Shipping address confirmed in Epic.     Delivery Scheduled: Yes, Expected medication delivery date: 03/11/21.     Medication will be delivered via UPS to the prescription address in Epic WAM.    Nancy Nordmann [facility] Specialty Technician

## 2021-03-10 NOTE — Unmapped (Signed)
Steven Nelson 's Vemlidy shipment will be delayed as a result of a different copay.      I have reached out to the patient  at [phone] and communicated the delay. We will wait for a call back from the patient to reschedule the delivery.  We have not confirmed the new delivery date.        Pt is aware of copay, and stated he would call back to approve/deny new copay.

## 2021-03-10 NOTE — Unmapped (Signed)
Addended by: Rennie Plowman on: 03/10/2021 08:28 AM     Modules accepted: Orders

## 2021-03-15 DIAGNOSIS — B181 Chronic viral hepatitis B without delta-agent: Principal | ICD-10-CM

## 2021-03-15 MED ORDER — TENOFOVIR DISOPROXIL FUMARATE 300 MG TABLET
ORAL_TABLET | Freq: Every day | ORAL | 0 refills | 30 days | Status: CP
Start: 2021-03-15 — End: ?

## 2021-03-15 NOTE — Unmapped (Signed)
V[facility]awaz 's Vemlidy shipment will be canceled  as a result of a high copay.     I have reached out to the patient  at [phone] and communicated the delay. We will not reschedule the medication and have removed this/these medication(s) from the work request.  We have canceled this work request.        Pt cannot afford $170 copay, financial assistance is unavailable at this time.

## 2021-03-15 NOTE — Unmapped (Signed)
[facility] Specialty Medication Onboarding    Specialty Medication: tenofovir disoproxil [name])  Prior Authorization: Not Required   Financial Assistance: No - patient doesn't qualify for additional assistance   Final Copay/Day Supply: $61.69 / 30    Insurance Restrictions: None     Notes to Pharmacist: None    The triage team has completed the benefits investigation and has determined that the patient is able to fill this medication at [facility]. Please contact the patient to complete the onboarding or follow up with the prescribing physician as needed.

## 2021-03-16 NOTE — Unmapped (Addendum)
Patient okay with copay of $170 for Vemlidy.  [employer] told him that he must exhaust his copay card before applying for manufacturer assistance, so he will fill Vemlidy this month and pay the remaining $170 and then apply for manufacturer assistance.       [facility] Clinical Assessment & Refill Coordination Note    Steven Nelson, DOB: February 12, 1986  Phone: [phone] (home)     All above HIPAA information was verified with patient.     Was a Nurse, learning disability used for this call? No    Specialty Medication(s):   Infectious Disease: Vemlidy     Current Outpatient Medications   Medication Sig Dispense Refill   ??? cholecalciferol, vitamin D3, (VITAMIN D3 ORAL) Take by mouth. Patient did not know dose     ??? magnesium 30 mg tablet Take 30 mg by mouth Two (2) times a day. Patient did not know the strength or salt form     ??? phytonadione 100 mcg tablet Take 100 mcg by mouth daily.     ??? tenofovir alafenamide (VEMLIDY) 25 mg Tab tablet Take 1 tablet (25 mg total) by mouth daily. 90 tablet 3   ??? tenofovir disoproxil (VIREAD) 300 mg tablet Take 1 tablet (300 mg total) by mouth daily. 30 tablet 0   ??? vitamin K2 100 mcg cap Take by mouth. Patient did not know the strength that he takes       No current facility-administered medications for this visit.        Changes to medications: [name] reports no changes at this time.    No Known Allergies    Changes to allergies: No    SPECIALTY MEDICATION ADHERENCE     Vemlidy 25 mg: 3 days of medicine on hand        Specialty medication(s) dose(s) confirmed: Regimen is correct and unchanged.     Are there any concerns with adherence? No    Adherence counseling provided? Not needed    CLINICAL MANAGEMENT AND INTERVENTION      Clinical Benefit Assessment:    Do you feel the medicine is effective or helping your condition? Yes    Clinical Benefit counseling provided? Not needed    Adverse Effects Assessment:    Are you experiencing any side effects? No    Are you experiencing difficulty administering your medicine? No    Quality of Life Assessment:    How many days over the past month did your hepatitis B  keep you from your normal activities? For example, brushing your teeth or getting up in the morning. Patient declined to answer    Have you discussed this with your provider? Not needed    Acute Infection Status:    Acute infections noted within Epic:  No active infections  Patient reported infection: None    Therapy Appropriateness:    Is therapy appropriate? Yes, therapy is appropriate and should be continued    DISEASE/MEDICATION-SPECIFIC INFORMATION      N/A    PATIENT SPECIFIC NEEDS     - Does the patient have any physical, cognitive, or cultural barriers? No    - Is the patient high risk? No    - Does the patient require a Care Management Plan? No     - Does the patient require physician intervention or other additional services (i.e. nutrition, smoking cessation, social work)? No      SHIPPING     Specialty Medication(s) to be Shipped:   Infectious Disease:  Vemlidy    Other medication(s) to be shipped: No additional medications requested for fill at this time     Changes to insurance: No    Delivery Scheduled: Yes, Expected medication delivery date: 5/12.     Medication will be delivered via UPS to the confirmed prescription address in [facility]    The patient will receive a drug information handout for each medication shipped and additional FDA Medication Guides as required.  Verified that patient has previously received a Conservation officer, historic buildings and a Surveyor, mining.    The patient or caregiver noted above participated in the development of this care plan and knows that they can request review of or adjustments to the care plan at any time.      All of the patient's questions and concerns have been addressed.    [name]   [facility] Specialty Pharmacist

## 2021-03-17 MED FILL — VEMLIDY 25 MG TABLET: ORAL | 30 days supply | Qty: 30 | Fill #11

## 2021-04-08 NOTE — Unmapped (Signed)
Hepatology Clinic Pharmacist Note    Current regimen: Vemlidy 25mg  daily with food    Pt has insurance plan with $7000 deductible and had exhausted the copay. Pt called to let me know he had been approved for Manufacturer assistance program:    [phone]  BIN: [id number]  GRP: [id number]  PCN: [id number]    Advised will inform [facility] to bill this for his refills but he may want to consider getting an insurance plan with less deductible for next year so we don't run into this issue again. Just prior to finding out this approval, we had went ahead and applied for grant to cover copays in case manufacturer assistance was denied:      Reminded pt of his appointment coming up with Dr. Foy Guadalajara on 05/05/21. Pt thanked me for assistance.    Park Breed, Pharm D., BCPS, BCGP, CPP  [facility]  [address]  [place], [place] [address]  [phone]

## 2021-04-09 MED ORDER — VEMLIDY 25 MG TABLET
ORAL_TABLET | Freq: Every day | ORAL | 3 refills | 90 days | Status: CN
Start: 2021-04-09 — End: 2022-04-09

## 2021-04-12 DIAGNOSIS — B181 Chronic viral hepatitis B without delta-agent: Principal | ICD-10-CM

## 2021-04-12 MED ORDER — VEMLIDY 25 MG TABLET
ORAL_TABLET | Freq: Every day | ORAL | 3 refills | 90.00000 days | Status: CP
Start: 2021-04-12 — End: 2022-04-12
  Filled 2021-04-14: qty 30, 30d supply, fill #0

## 2021-04-13 NOTE — Unmapped (Signed)
[facility] Clinical Assessment & Refill Coordination Note    Steven Nelson, DOB: 08-12-1986  Phone: [phone] (home)     All above HIPAA information was verified with patient.     Was a Nurse, learning disability used for this call? No    Specialty Medication(s):   Infectious Disease: Vemlidy     Current Outpatient Medications   Medication Sig Dispense Refill   ??? cholecalciferol, vitamin D3, (VITAMIN D3 ORAL) Take by mouth. Patient did not know dose     ??? magnesium 30 mg tablet Take 30 mg by mouth Two (2) times a day. Patient did not know the strength or salt form     ??? phytonadione 100 mcg tablet Take 100 mcg by mouth daily.     ??? tenofovir alafenamide (VEMLIDY) 25 mg Tab tablet Take 1 tablet (25 mg total) by mouth daily. 90 tablet 3   ??? vitamin K2 100 mcg cap Take by mouth. Patient did not know the strength that he takes       No current facility-administered medications for this visit.        Changes to medications: Kaitlin reports no changes at this time.    No Known Allergies    Changes to allergies: No    SPECIALTY MEDICATION ADHERENCE     Vemlidy 25 mg: approximately 10 days of medicine on hand       Medication Adherence    Patient reported X missed doses in the last month: 0  Specialty Medication: Vemlidy 25mg   Patient is on additional specialty medications: No  Demonstrates understanding of importance of adherence: yes  Informant: patient  Provider-estimated medication adherence level: good  Patient is at risk for Non-Adherence: No          Specialty medication(s) dose(s) confirmed: Regimen is correct and unchanged.     Are there any concerns with adherence? No    Adherence counseling provided? Not needed    CLINICAL MANAGEMENT AND INTERVENTION      Clinical Benefit Assessment:    Do you feel the medicine is effective or helping your condition? Yes      HEPATITIS B AND ASSOCIATED LABS:       Lab Results   Component Value Date/Time    HBVDNAQT Not Detected 10/21/2020 09:52 AM    HBVDNAQT Not Detected 02/19/2020 08:58 AM    HBVDNAQT Not Detected 12/20/2018 11:40 AM    HBVDNACP <10 01/21/2016 12:16 PM    HBEAG Negative 10/21/2020 09:52 AM    HBEAG Negative 02/19/2020 08:58 AM    HBEAG Negative 12/20/2018 11:40 AM    HBSAG (A) 10/21/2020 09:52 AM     For final results see Hepatitis B Surface Antigen Neutralization    HBSAG (A) 12/06/2016 09:50 AM     For final results see Hepatitis B Surface Antigen Neutralization    HBSAG (A) 08/16/2016 10:27 AM     For final results see Hepatitis B Surface Antigen Neutralization    HBSAG see below 12/31/2014 11:33 AM    HBSAG see below 02/04/2014 02:00 PM    HBSAG see below 02/28/2013 09:57 AM    HEPBSAB Nonreactive 12/06/2016 09:50 AM    HEPBSAB Nonreactive 11/24/2015 10:22 AM    ALT 21 10/21/2020 09:52 AM    ALT 22 02/19/2020 08:58 AM    ALT 23 12/20/2018 11:40 AM    ALT 35 09/28/2015 03:20 PM    ALT 51 12/31/2014 11:33 AM    ALT 45 10/14/2014 10:55 AM  AST 20 10/21/2020 09:52 AM    AST 25 09/28/2015 03:20 PM    CREATININE 0.94 10/21/2020 09:52 AM    CREATININE 0.89 02/19/2020 08:58 AM    CREATININE 0.91 12/20/2018 11:40 AM    CREATININE 0.91 12/31/2014 11:33 AM    CREATININE 0.94 10/14/2014 10:55 AM    CREATININE 1.08 07/22/2014 10:55 AM       Clinical Benefit counseling provided? Labs from 10/21/20 show evidence of clinical benefit    Adverse Effects Assessment:    Are you experiencing any side effects? No    Are you experiencing difficulty administering your medicine? No    Quality of Life Assessment:    How many days over the past month did your Hepatitis B  keep you from your normal activities? For example, brushing your teeth or getting up in the morning. 0    Have you discussed this with your provider? Not needed    Acute Infection Status:    Acute infections noted within Epic:  No active infections  Patient reported infection: None    Therapy Appropriateness:    Is therapy appropriate? Yes, therapy is appropriate and should be continued    DISEASE/MEDICATION-SPECIFIC INFORMATION      N/A    PATIENT SPECIFIC NEEDS     - Does the patient have any physical, cognitive, or cultural barriers? No    - Is the patient high risk? No    - Does the patient require a Care Management Plan? No     - Does the patient require physician intervention or other additional services (i.e. nutrition, smoking cessation, social work)? No      SHIPPING     Specialty Medication(s) to be Shipped:   Infectious Disease: Vemlidy    Other medication(s) to be shipped: No additional medications requested for fill at this time     Changes to insurance: No    Delivery Scheduled: Yes, Expected medication delivery date: 04/15/21.     Medication will be delivered via UPS to the confirmed prescription address in [facility].    The patient will receive a drug information handout for each medication shipped and additional FDA Medication Guides as required.  Verified that patient has previously received a Conservation officer, historic buildings and a Surveyor, mining.    The patient or caregiver noted above participated in the development of this care plan and knows that they can request review of or adjustments to the care plan at any time.      All of the patient's questions and concerns have been addressed.    Roderic Palau   [facility] Specialty Pharmacist

## 2021-05-05 ENCOUNTER — Encounter: Admit: 2021-05-05 | Discharge: 2021-05-05 | Payer: PRIVATE HEALTH INSURANCE

## 2021-05-05 ENCOUNTER — Encounter
Admit: 2021-05-05 | Discharge: 2021-05-05 | Payer: PRIVATE HEALTH INSURANCE | Attending: Gastroenterology | Primary: Gastroenterology

## 2021-05-05 DIAGNOSIS — B181 Chronic viral hepatitis B without delta-agent: Principal | ICD-10-CM

## 2021-05-05 LAB — COMPREHENSIVE METABOLIC PANEL
ALBUMIN: 4.4 g/dL (ref 3.4–5.0)
ALKALINE PHOSPHATASE: 75 U/L (ref 46–116)
ALT (SGPT): 20 U/L (ref 10–49)
ANION GAP: 7 mmol/L (ref 5–14)
AST (SGOT): 20 U/L (ref <=34)
BILIRUBIN TOTAL: 0.4 mg/dL (ref 0.3–1.2)
BLOOD UREA NITROGEN: 13 mg/dL (ref 9–23)
BUN / CREAT RATIO: 14
CALCIUM: 9.9 mg/dL (ref 8.7–10.4)
CHLORIDE: 102 mmol/L (ref 98–107)
CO2: 29.3 mmol/L (ref 20.0–31.0)
CREATININE: 0.96 mg/dL
EGFR CKD-EPI (2021) MALE: >90 mL/min/{1.73_m2} (ref >=60)
GLUCOSE RANDOM: 78 mg/dL (ref 70–179)
POTASSIUM: 3.6 mmol/L (ref 3.4–4.8)
PROTEIN TOTAL: 7.8 g/dL (ref 5.7–8.2)
SODIUM: 138 mmol/L (ref 135–145)

## 2021-05-05 LAB — CBC
HEMATOCRIT: 46.8 % (ref 39.0–48.0)
HEMOGLOBIN: 15.6 g/dL (ref 12.9–16.5)
MEAN CORPUSCULAR HEMOGLOBIN CONC: 33.4 g/dL (ref 32.0–36.0)
MEAN CORPUSCULAR HEMOGLOBIN: 25.1 pg — ABNORMAL LOW (ref 25.9–32.4)
MEAN CORPUSCULAR VOLUME: 75.2 fL — ABNORMAL LOW (ref 77.6–95.7)
MEAN PLATELET VOLUME: 8.4 fL (ref 6.8–10.7)
PLATELET COUNT: 175 10*9/L (ref 150–450)
RED BLOOD CELL COUNT: 6.22 10*12/L — ABNORMAL HIGH (ref 4.26–5.60)
RED CELL DISTRIBUTION WIDTH: 14.6 % (ref 12.2–15.2)
WBC ADJUSTED: 5.6 10*9/L (ref 3.6–11.2)

## 2021-05-05 LAB — AFP TUMOR MARKER: AFP-TUMOR MARKER: <2 ng/mL (ref <=8)

## 2021-05-05 NOTE — Unmapped (Signed)
Chronic hepatitis B, doing well on Vemlidy. Continue this medication. Labs today. Liver ultrasound from April looked good. Repeat ultrasound ordered for [facility], call to schedule just prior to your next [facility] visit in 6 months so I have results to review at your visit.

## 2021-05-06 LAB — HEPATITIS B DNA, QUANTITATIVE, PCR: HBV DNA QUANT: NOT DETECTED

## 2021-05-12 NOTE — Unmapped (Signed)
[facility]      [phone]      Name:Steven Nelson   UXLK:[id number]   Age:35 y.o.   Sex:M   Race:Asian       CHIEF COMPLAINT: Study followup after completing [facility] IA protocol. Last visit in 12/2018.     HISTORY OF PRESENT ILLNESS: Steven Nelson is a 35 y.o.  [place] with chronic HBV originally HBeAg + disease. He was in HBV cohort study then transitioned to treatment. His first PEG injection 03/30/12 for immune active treatment trial, randomized PEG and tenofovir PEG reduced to 135 mcg for side effects including weight loss. He completed PEG at week 24. He has continued per protocol on tenofovir alone. He has persistently undetectable HBV DNA although his Hep Be Ag status fluctuates from positive to negative at various times.      Interval history: Patient feels well. Had Covid in 2021. Subsequently vaccinated but not boosted. Denies N/V abd pain, chest pain, or SOB. Working full time. 100% adherent to [facility] (switched from tenofovir on June 2019).     PAST MEDICAL HISTORY:   1. Chronic hepatitis B, genotype C.   2. Dyslipidemia.   3. Lactose intolerance   4. 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   5. HCC surveillance: [facility]-No masses  6. HAV immune  7. 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 He works at a [employer]. Does not drink or smoke.    FAMILY HISTORY: There is no family history of hepatocellular carcinoma.     No Known Allergies    Current Outpatient Medications   Medication Sig Dispense Refill   ??? cholecalciferol, vitamin D3, (VITAMIN D3 ORAL) Take by mouth. Patient did not know dose (Patient not taking: Reported on 05/05/2021)     ??? magnesium 30 mg tablet Take 30 mg by mouth Two (2) times a day. Patient did not know the strength or salt form (Patient not taking: Reported on 05/05/2021)     ??? phytonadione 100 mcg tablet Take 100 mcg by mouth daily. (Patient not taking: Reported on 05/05/2021)     ??? tenofovir alafenamide (VEMLIDY) 25 mg Tab tablet Take 1 tablet (25 mg total) by mouth daily. (Patient not taking: Reported on 05/05/2021) 90 tablet 3   ??? vitamin K2 100 mcg cap Take by mouth. Patient did not know the strength that he takes (Patient not taking: Reported on 05/05/2021)       No current facility-administered medications for this visit.         PHYSICAL EXAMINATION:   BP 130/72  - Pulse 72  - Temp 36.6 ??C (97.9 ??F) (Temporal)  - Wt 63.8 kg (140 lb 9.6 oz)  - SpO2 100%  - BMI 23.40 kg/m??         GENERAL: This is a thin [place] male in no acute distress.   Pleasant individual in NAD  HEENT: Sclera are anicteric, no temporal muscle loss  NECK: No thyromegaly or lymphadenopathy, No carotid bruits  Abdomen: Soft, non-tender, non-distended, no hepatosplenomegaly, no masses appreciated, no ascites  Skin: No spider angiomata, No rashes  Extremities: Without pedal edema, no palmar erythema  Neuro: Grossly intact, No focal deficits      Results for orders placed or performed in visit on 05/05/21   CBC   Result Value Ref Range    WBC 5.6 3.6 - 11.2 10*9/L    RBC 6.22 (H) 4.26 - 5.60 10*12/L  HGB 15.6 12.9 - 16.5 g/dL    HCT 16.1 09.6 - 04.5 %    MCV 75.2 (L) 77.6 - 95.7 fL    MCH 25.1 (L) 25.9 - 32.4 pg    MCHC 33.4 32.0 - 36.0 g/dL    RDW 40.9 81.1 - 91.4 %    MPV 8.4 6.8 - 10.7 fL    Platelet 175 150 - 450 10*9/L   Comprehensive Metabolic Panel   Result Value Ref Range    Sodium 138 135 - 145 mmol/L    Potassium 3.6 3.4 - 4.8 mmol/L    Chloride 102 98 - 107 mmol/L    CO2 29.3 20.0 - 31.0 mmol/L    Anion Gap 7 5 - 14 mmol/L    BUN 13 9 - 23 mg/dL    Creatinine 7.82 9.56 - 1.10 mg/dL    BUN/Creatinine Ratio 14     eGFR CKD-EPI (2021) Male >90 >=60 mL/min/1.65m2    Glucose 78 70 - 179 mg/dL    Calcium 9.9 8.7 - 21.3 mg/dL    Albumin 4.4 3.4 - 5.0 g/dL    Total Protein 7.8 5.7 - 8.2 g/dL    Total Bilirubin 0.4 0.3 - 1.2 mg/dL    AST 20 <=08 U/L    ALT 20 10 - 49 U/L    Alkaline Phosphatase 75 46 - 116 U/L   Hepatitis B DNA, Quantitative, PCR   Result Value Ref Range    HBV DNA Quant Not Detected Not Detected   AFP tumor marker   Result Value Ref Range    AFP-Tumor Marker <2 <=8 ng/mL         Impression:    1.  Chronic hepatitis B: Patient was participant in the [facility] immune active clinical trial.  He was treated with tenofovir.  He was switched to [facility] in June 2019.  He continues on this medication.  I reinforced the importance of ongoing follow-up. Labs as above.    2. HCC surveillance: U/S 02/2021- negative for masses.     3.  Prior Covid infection: Covid vaccinated and recommended he get booster.     3. 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 ? Underlying Thalassemia trait    Patient will return for follow-up in 6 months.  Liver ultrasound around next visit.       Alba Destine, M.D.  Professor of Medicine  Director, [facility]  [facility] [place] [facility] [place]    [phone]

## 2021-05-12 NOTE — Unmapped (Signed)
[facility] Refill Coordination Note    Specialty Medication(s) to be Shipped:   Infectious Disease: Vemlidy    Other medication(s) to be shipped: No additional medications requested for fill at this time     Steven Nelson, DOB: 1986/01/09  Phone: [phone] (home)       All above HIPAA information was verified with patient.     Was a Nurse, learning disability used for this call? No    Completed refill call assessment today to schedule patient's medication shipment from the [facility] [phone]).  All relevant notes have been reviewed.     Specialty medication(s) and dose(s) confirmed: Regimen is correct and unchanged.   Changes to medications: Steven Nelson reports no changes at this time.  Changes to insurance: No  New side effects reported not previously addressed with a pharmacist or physician: None reported  Questions for the pharmacist: No    Confirmed patient received a Conservation officer, historic buildings and a Surveyor, mining with first shipment. The patient will receive a drug information handout for each medication shipped and additional FDA Medication Guides as required.       DISEASE/MEDICATION-SPECIFIC INFORMATION        N/A    SPECIALTY MEDICATION ADHERENCE     Medication Adherence    Patient reported X missed doses in the last month: 0  Specialty Medication: vemlidy 25mg   Patient is on additional specialty medications: No        Were doses missed due to medication being on hold? No    Vemlidy 25 mg: 10 days of medicine on hand     REFERRAL TO PHARMACIST     Referral to the pharmacist: Not needed      [facility]     Shipping address confirmed in Epic.     Delivery Scheduled: Yes, Expected medication delivery date: 05/18/2021.     Medication will be delivered via UPS to the prescription address in Epic WAM.    Lorelei Pont [facility] Specialty Technician

## 2021-05-17 MED FILL — VEMLIDY 25 MG TABLET: ORAL | 30 days supply | Qty: 30 | Fill #1

## 2021-06-09 NOTE — Unmapped (Signed)
[facility] Specialty Pharmacy Refill Coordination Note    Specialty Medication(s) to be Shipped:   Infectious Disease: Vemlidy    Other medication(s) to be shipped: No additional medications requested for fill at this time     Steven Nelson, DOB: 08/04/1986  Phone: [phone] (home)       All above HIPAA information was verified with patient.     Was a Nurse, learning disability used for this call? No    Completed refill call assessment today to schedule patient's medication shipment from the [facility] [phone]).  All relevant notes have been reviewed.     Specialty medication(s) and dose(s) confirmed: Regimen is correct and unchanged.   Changes to medications: Steven Nelson reports no changes at this time.  Changes to insurance: No  New side effects reported not previously addressed with a pharmacist or physician: None reported  Questions for the pharmacist: No    Confirmed patient received a Conservation officer, historic buildings and a Surveyor, mining with first shipment. The patient will receive a drug information handout for each medication shipped and additional FDA Medication Guides as required.       DISEASE/MEDICATION-SPECIFIC INFORMATION        N/A    SPECIALTY MEDICATION ADHERENCE     Medication Adherence    Patient reported X missed doses in the last month: 0  Specialty Medication: vemlidy 25mg   Patient is on additional specialty medications: No  Patient is on more than two specialty medications: No  Any gaps in refill history greater than 2 weeks in the last 3 months: no  Demonstrates understanding of importance of adherence: yes  Informant: patient  Reliability of informant: reliable  Confirmed plan for next specialty medication refill: delivery by pharmacy  Refills needed for supportive medications: not needed        Were doses missed due to medication being on hold? No    Vemlidy 25 mg: 10 days of medicine on hand     REFERRAL TO PHARMACIST     Referral to the pharmacist: Not needed      [facility]     Shipping address confirmed in Epic.     Delivery Scheduled: Yes, Expected medication delivery date: 06/16/2021.     Medication will be delivered via UPS to the prescription address in Epic WAM.    Chau Sawin D Steven Nelson   [facility] Shared [facility] Specialty Technician

## 2021-06-15 MED FILL — VEMLIDY 25 MG TABLET: ORAL | 30 days supply | Qty: 30 | Fill #2

## 2021-07-09 NOTE — Unmapped (Signed)
[facility] Specialty Pharmacy Refill Coordination Note    Specialty Medication(s) to be Shipped:   Infectious Disease: Vemlidy    Other medication(s) to be shipped: No additional medications requested for fill at this time     Steven Nelson, DOB: [birth year]  Phone: [phone] (home)       All above HIPAA information was verified with patient.     Was a Nurse, learning disability used for this call? No    Completed refill call assessment today to schedule patient's medication shipment from the [facility] [phone]).  All relevant notes have been reviewed.     Specialty medication(s) and dose(s) confirmed: Regimen is correct and unchanged.   Changes to medications: Deral reports no changes at this time.  Changes to insurance: No  New side effects reported not previously addressed with a pharmacist or physician: None reported  Questions for the pharmacist: No    Confirmed patient received a Conservation officer, historic buildings and a Surveyor, mining with first shipment. The patient will receive a drug information handout for each medication shipped and additional FDA Medication Guides as required.       DISEASE/MEDICATION-SPECIFIC INFORMATION        N/A    SPECIALTY MEDICATION ADHERENCE     Medication Adherence    Patient reported X missed doses in the last month: 0  Specialty Medication: vemlidy 25mg               Were doses missed due to medication being on hold? No    Unable to confirm quantity on hand    REFERRAL TO PHARMACIST     Referral to the pharmacist: Not needed      [facility]     Shipping address confirmed in Epic.     Delivery Scheduled: Yes, Expected medication delivery date: 9/8.     Medication will be delivered via Next Day Courier to the prescription address in Epic WAM.    [name]   [facility] Specialty Technician

## 2021-07-14 DIAGNOSIS — B181 Chronic viral hepatitis B without delta-agent: Principal | ICD-10-CM

## 2021-07-14 MED FILL — VEMLIDY 25 MG TABLET: ORAL | 30 days supply | Qty: 30 | Fill #3

## 2021-08-06 NOTE — Unmapped (Signed)
[facility] Specialty Pharmacy Refill Coordination Note    Specialty Medication(s) to be Shipped:   Infectious Disease: Vemlidy    Other medication(s) to be shipped: No additional medications requested for fill at this time     Tyrell Seifer, DOB: [birth year]  Phone: [phone] (home)       All above HIPAA information was verified with patient.     Was a Nurse, learning disability used for this call? No    Completed refill call assessment today to schedule patient's medication shipment from the [facility] [phone]).  All relevant notes have been reviewed.     Specialty medication(s) and dose(s) confirmed: Regimen is correct and unchanged.   Changes to medications: Ishmel reports no changes at this time.  Changes to insurance: No  New side effects reported not previously addressed with a pharmacist or physician: None reported  Questions for the pharmacist: No    Confirmed patient received a Conservation officer, historic buildings and a Surveyor, mining with first shipment. The patient will receive a drug information handout for each medication shipped and additional FDA Medication Guides as required.       DISEASE/MEDICATION-SPECIFIC INFORMATION        N/A    SPECIALTY MEDICATION ADHERENCE     Medication Adherence    Patient reported X missed doses in the last month: 1  Specialty Medication: Vemlidy 25mg               Were doses missed due to medication being on hold? No    vemlidy 25mg   : 10 days of medicine on hand       REFERRAL TO PHARMACIST     Referral to the pharmacist: Not needed      [facility]     Shipping address confirmed in Epic.     Delivery Scheduled: Yes, Expected medication delivery date: 10/6.     Medication will be delivered via UPS to the prescription address in Epic WAM.    [name]   [facility] Specialty Technician

## 2021-08-11 MED FILL — VEMLIDY 25 MG TABLET: ORAL | 30 days supply | Qty: 30 | Fill #4

## 2021-09-07 NOTE — Unmapped (Signed)
[facility] Refill Coordination Note    Specialty Medication(s) to be Shipped:   Infectious Disease: Vemlidy    Other medication(s) to be shipped: No additional medications requested for fill at this time     Steven Nelson, DOB: August 19, 1986  Phone: [phone] (home)       All above HIPAA information was verified with patient.     Was a Nurse, learning disability used for this call? No    Completed refill call assessment today to schedule patient's medication shipment from the [facility] [phone]).  All relevant notes have been reviewed.     Specialty medication(s) and dose(s) confirmed: Regimen is correct and unchanged.   Changes to medications: Saajan reports no changes at this time.  Changes to insurance: No  New side effects reported not previously addressed with a pharmacist or physician: None reported  Questions for the pharmacist: No    Confirmed patient received a Conservation officer, historic buildings and a Surveyor, mining with first shipment. The patient will receive a drug information handout for each medication shipped and additional FDA Medication Guides as required.       DISEASE/MEDICATION-SPECIFIC INFORMATION        N/A    SPECIALTY MEDICATION ADHERENCE     Medication Adherence    Patient reported X missed doses in the last month: 0  Specialty Medication: Vemlidy  Patient is on additional specialty medications: No  Patient is on more than two specialty medications: No  Any gaps in refill history greater than 2 weeks in the last 3 months: no  Demonstrates understanding of importance of adherence: yes  Informant: patient  Reliability of informant: reliable  Confirmed plan for next specialty medication refill: delivery by pharmacy  Refills needed for supportive medications: not needed              Were doses missed due to medication being on hold? No    VEMLIDY 25 mg : 10 days of medicine on hand       REFERRAL TO PHARMACIST     Referral to the pharmacist: Not needed      [facility] Shipping address confirmed in Epic.     Delivery Scheduled: Yes, Expected medication delivery date: 09/15/21.     Medication will be delivered via UPS to the prescription address in Epic WAM.    Steven Nelson   [facility] Specialty Technician

## 2021-09-14 MED FILL — VEMLIDY 25 MG TABLET: ORAL | 30 days supply | Qty: 30 | Fill #5

## 2021-10-09 NOTE — Unmapped (Signed)
[facility] Clinical Assessment & Refill Coordination Note    Steven Nelson, DOB: [birth year]  Phone: [phone] (home)     All above HIPAA information was verified with patient.     Was a Nurse, learning disability used for this call? No    Specialty Medication(s):   Infectious Disease: Vemlidy     Current Outpatient Medications   Medication Sig Dispense Refill   ??? cholecalciferol, vitamin D3, (VITAMIN D3 ORAL) Take by mouth. Patient did not know dose (Patient not taking: Reported on 05/05/2021)     ??? magnesium 30 mg tablet Take 30 mg by mouth Two (2) times a day. Patient did not know the strength or salt form (Patient not taking: Reported on 05/05/2021)     ??? phytonadione 100 mcg tablet Take 100 mcg by mouth daily. (Patient not taking: Reported on 05/05/2021)     ??? tenofovir alafenamide (VEMLIDY) 25 mg Tab tablet Take 1 tablet (25 mg total) by mouth daily. (Patient not taking: Reported on 05/05/2021) 90 tablet 3   ??? vitamin K2 100 mcg cap Take by mouth. Patient did not know the strength that he takes (Patient not taking: Reported on 05/05/2021)       No current facility-administered medications for this visit.        Changes to medications: Zai reports no changes at this time.    No Known Allergies    Changes to allergies: No    SPECIALTY MEDICATION ADHERENCE     Vemlidy  25 mg: approximately 7 days of medicine on hand       Medication Adherence    Patient reported X missed doses in the last month: 0  Specialty Medication: Vemlidy 25 mg  Patient is on additional specialty medications: No  Any gaps in refill history greater than 2 weeks in the last 3 months: no  Demonstrates understanding of importance of adherence: yes  Informant: patient  Provider-estimated medication adherence level: good  Patient is at risk for Non-Adherence: No          Specialty medication(s) dose(s) confirmed: Regimen is correct and unchanged.     Are there any concerns with adherence? No    Adherence counseling provided? Not needed    CLINICAL MANAGEMENT AND INTERVENTION      Clinical Benefit Assessment:    Do you feel the medicine is effective or helping your condition? Yes      HEPATITIS B AND ASSOCIATED LABS:       Lab Results   Component Value Date/Time    HBVDNAQT Not Detected 05/05/2021 09:35 AM    HBVDNAQT Not Detected 10/21/2020 09:52 AM    HBVDNAQT Not Detected 02/19/2020 08:58 AM    HBVDNACP <10 01/21/2016 12:16 PM    HBEAG Negative 10/21/2020 09:52 AM    HBEAG Negative 02/19/2020 08:58 AM    HBEAG Negative 12/20/2018 11:40 AM    HBSAG (A) 10/21/2020 09:52 AM     For final results see Hepatitis B Surface Antigen Neutralization    HBSAG (A) 12/06/2016 09:50 AM     For final results see Hepatitis B Surface Antigen Neutralization    HBSAG (A) 08/16/2016 10:27 AM     For final results see Hepatitis B Surface Antigen Neutralization    HBSAG see below 12/31/2014 11:33 AM    HBSAG see below 02/04/2014 02:00 PM    HBSAG see below 02/28/2013 09:57 AM    HEPBSAB Nonreactive 12/06/2016 09:50 AM    HEPBSAB Nonreactive 11/24/2015 10:22 AM  ALT 20 05/05/2021 09:35 AM    ALT 21 10/21/2020 09:52 AM    ALT 22 02/19/2020 08:58 AM    ALT 35 09/28/2015 03:20 PM    ALT 51 12/31/2014 11:33 AM    ALT 45 10/14/2014 10:55 AM    AST 20 05/05/2021 09:35 AM    AST 25 09/28/2015 03:20 PM    CREATININE 0.96 05/05/2021 09:35 AM    CREATININE 0.94 10/21/2020 09:52 AM    CREATININE 0.89 02/19/2020 08:58 AM    CREATININE 0.91 12/31/2014 11:33 AM    CREATININE 0.94 10/14/2014 10:55 AM    CREATININE 1.08 07/22/2014 10:55 AM       Clinical Benefit counseling provided? Labs from 05/05/21 show evidence of clinical benefit    Adverse Effects Assessment:    Are you experiencing any side effects? No    Are you experiencing difficulty administering your medicine? No    Quality of Life Assessment:    How many days over the past month did your Hepatitis B  keep you from your normal activities? For example, brushing your teeth or getting up in the morning. 0    Have you discussed this with your provider? Not needed    Acute Infection Status:    Acute infections noted within Epic:  No active infections  Patient reported infection: None    Therapy Appropriateness:    Is therapy appropriate and patient progressing towards therapeutic goals? Yes, therapy is appropriate and should be continued    DISEASE/MEDICATION-SPECIFIC INFORMATION      N/A    PATIENT SPECIFIC NEEDS     - Does the patient have any physical, cognitive, or cultural barriers? No    - Is the patient high risk? No    - Does the patient require a Care Management Plan? No     - Does the patient require physician intervention or other additional services (i.e. nutrition, smoking cessation, social work)? No      SHIPPING     Specialty Medication(s) to be Shipped:   Infectious Disease: Vemlidy    Other medication(s) to be shipped: No additional medications requested for fill at this time     Changes to insurance: No    Delivery Scheduled: Yes, Expected medication delivery date: 10/12/21.     Medication will be delivered via UPS to the confirmed prescription address in [facility].    The patient will receive a drug information handout for each medication shipped and additional FDA Medication Guides as required.  Verified that patient has previously received a Conservation officer, historic buildings and a Surveyor, mining.    The patient or caregiver noted above participated in the development of this care plan and knows that they can request review of or adjustments to the care plan at any time.      All of the patient's questions and concerns have been addressed.    Roderic Palau   [facility] Specialty Pharmacist

## 2021-10-11 MED FILL — VEMLIDY 25 MG TABLET: ORAL | 30 days supply | Qty: 30 | Fill #6

## 2021-11-05 NOTE — Unmapped (Signed)
[facility] Refill Coordination Note  ??  Specialty Medication(s) to be Shipped:   Infectious Disease: Vemlidy  ??  Other medication(s) to be shipped: No additional medications requested for fill at this time   ??  Steven Nelson, DOB: November 11, 1985  Phone: [phone] (home)   ??  ??  All above HIPAA information was verified with patient.   ??  Was a Nurse, learning disability used for this call? No  ??  Completed refill call assessment today to schedule patient's medication shipment from the [facility] [phone]).?? All relevant notes have been reviewed.  ??  Specialty medication(s) and dose(s) confirmed: Regimen is correct and unchanged.   Changes to medications: Steven Nelson reports no changes at this time.  Changes to insurance: No  New side effects reported not previously addressed with a pharmacist or physician: None reported  Questions for the pharmacist: No  ??  Confirmed patient received a Conservation officer, historic buildings and a Surveyor, mining with first shipment. The patient will receive a drug information handout for each medication shipped and additional FDA Medication Guides as required.??    ??  DISEASE/MEDICATION-SPECIFIC INFORMATION   ??     N/A  ??  SPECIALTY MEDICATION ADHERENCE   ??  Medication Adherence    Patient reported X missed doses in the last month: 0  Specialty Medication: Vemlidy  Patient is on additional specialty medications: No  Patient is on more than two specialty medications: No  Any gaps in refill history greater than 2 weeks in the last 3 months: no  Demonstrates understanding of importance of adherence: yes  Informant: patient  Reliability of informant: reliable  Confirmed plan for next specialty medication refill: delivery by pharmacy  Refills needed for supportive medications: not needed  ??      ??  ??  Were doses missed due to medication being on hold? No  ??  VEMLIDY 25 mg : 10 days of medicine on hand   ??  ??  REFERRAL TO PHARMACIST   ??  Referral to the pharmacist: Not needed      ??  SHIPPING   ??  Shipping address confirmed in Epic.   ??  Delivery Scheduled: Yes, Expected medication delivery date: 1/5.   ??  Medication will be delivered via UPS to the prescription address in [facility].  ??  Steven Nelson  [facility] Specialty Technician

## 2021-11-10 NOTE — Unmapped (Signed)
Steven Nelson 's Vemlidy shipment will be delayed as a result of the patient's insurance being terminated.      I have reached out to the patient  at [phone] and left a voicemail message.  We will wait for a call back from the patient to reschedule the delivery.  We have not confirmed the new delivery date.

## 2021-11-11 DIAGNOSIS — B181 Chronic viral hepatitis B without delta-agent: Principal | ICD-10-CM

## 2021-11-18 DIAGNOSIS — B181 Chronic viral hepatitis B without delta-agent: Principal | ICD-10-CM

## 2021-11-18 MED ORDER — TENOFOVIR DISOPROXIL FUMARATE 300 MG TABLET
ORAL_TABLET | Freq: Every day | ORAL | 0 refills | 30 days | Status: CP
Start: 2021-11-18 — End: 2021-12-18
  Filled 2021-11-19: qty 30, 30d supply, fill #0

## 2021-11-18 NOTE — Unmapped (Signed)
[facility]   Patient Onboarding/Medication Counseling    Steven Nelson is a 36 y.o. male with Hepatitis B who I am counseling today on initiation of therapy.  I am speaking to the patient.    Was a Nurse, learning disability used for this call? No    Verified patient's date of birth / HIPAA.    Specialty medication(s) to be sent: Infectious Disease: tenofovir disoproxil fumarate      Non-specialty medications/supplies to be sent: n/a      Medications not needed at this time: n/a       Tenofovir disoproxil fumarate 300mg  tablet (Viread 300mg  tablet)    The patient declined counseling on medication administration, missed dose instructions, goals of therapy, side effects and monitoring parameters, warnings and precautions, drug/food interactions and storage, handling precautions, and disposal because they have taken the medication previously. The information in the declined sections below are for informational purposes only and was not discussed with patient.       Medication & Administration     Dosage: Take one tablet by mouth once daily    Administration: Take with or without food    Dosing: Renal Impairment: Adult  ??? CrCl 30 to 49 mL/minute: 300 mg every 48 hours  ??? CrCl 10 to 29 mL/minute: 300 mg every 72 to 96 hours  ??? CrCl <10 mL/minute: No dosage adjustments provided in the manufacturer's labeling (has not been studied).  ??? Hemodialysis: 300 mg following dialysis every 7 days or after approximately 12 hours of dialysis (usually once weekly assuming 3 dialysis sessions lasting about 4 hours each).  Adherence/Missed dose instructions:   ??? Take a missed dose as soon as you remember. If it is close to the time for your next dose, skip the missed dose and go back to your next scheduled dose.  ??? Do not take 2 doses at the same time or extra doses.  ??? Avoid missing doses as it can result in development of resistance.    Goals of Therapy     To keep HBV DNA levels undetectable.  To decrease the morbidity and mortality related to chronic hepatitis B.    Side Effects & Monitoring Parameters   ??? HBV patients with compensated liver disease:   o Nausea  ??? HBV patients with decompensated liver disease:   o Abdominal pain  o Nausea  o Insomnia  o Pruritus  o Vomiting  o Dizziness  o Fever     The following side effects should be reported to the provider:  ??? Signs of an allergic reaction like rash, hives, itching, red, swollen, blistered, or peeling skin with or without fever. If you have wheezing, tightness in the chest or throat, trouble breathing, swallowing, or talking, unusual hoarseness, or swelling of the mouth, face, lips, tongue, or throat, call 911 or go to emergency department of the closest hospital.  ??? Signs of kidney problems like unable to pass urine, change in how much urine is passed, blood in the urine, or a big weight gain.  ??? Signs of liver problems like dark urine, weakness, loss of appetite for several days or longer, persistent or worsening upset stomach or stomach pain with nausea and vomiting, light-colored stools, or yellow skin or eyes.   ??? Signs of too much lactic acid in the blood (lactic acidosis) like weakness or being more tired than usual, unusual muscle pain, being short of breath or fast breathing, stomach pain with nausea and vomiting, cold or blue hands  and feet, feel dizzy or lightheaded, or a fast or abnormal heartbeat.Marland Kitchen  ??? Persistent or worsening bone pain, pain in extremities, fractures and/or muscular pain may be manifestations of proximal renal tubulopathy and should prompt an evaluation of renal function in patients at risk of renal dysfunction.    Monitoring Parameters:  ??? HIV status prior to initiation of therapy  ??? HBV e antigen, HBV e antibody, and HBV DNA: every 3-6 months or as clinically indicated  ??? HBV surface antigen, HBV surface Antibody: as clinically indicated   ?? Serum creatinine, estimated creatinine clearance, urine glucose, and urine protein prior to initiation of therapy and as clinically indicated during therapy. . Also assess serum phosphorus in chronic kidney disease.  ??? Liver function tests (AST, ALT, Alk Phos, albumin, and bilirubin) and INR as clinically indicated  ??? Bone density (patients with history of bone fracture or have risk factors for bone loss) as clinically indicated       Contraindications, Warnings, & Precautions     ?? Lactic acidosis/hepatomegaly: Lactic acidosis and severe hepatomegaly with steatosis, sometimes fatal, have been reported with the use of nucleoside analogs, alone or in combination with other antiretrovirals. Suspend treatment in any patient who develops clinical or laboratory findings suggestive of lactic acidosis or pronounced hepatotoxicity (marked transaminase elevation may/may not accompany hepatomegaly and steatosis).  ?? Hepatitis B acute exacerbation: [US Boxed Warning]: Discontinuation of anti-hepatitis B therapy may result in severe acute exacerbations of hepatitis B. Monitor clinical and laboratory data closely for several months after treatment discontinuation. If clinically indicated, anti-hepatitis B therapy may be resumed.  ?? HIV-1 and HBV coinfection: Should not be used as a single agent for the treatment of HIV-1 due to resistance development risk.  ?? Decreased bone mineral density: Consider assessment of BMD in patients with a history of pathologic fracture or other risk factors for osteoporosis or bone loss.  ?? New onset or worsening renal impairment: Can include acute renal failure and Fanconi syndrome. Avoid administering tenofovir disoproxil fumarate with concurrent or recent use of nephrotoxic drugs.  ?? Osteomalacia and renal dysfunction: May cause osteomalacia with proximal renal tubulopathy. Bone pain, extremity pain, fractures, arthralgias, weakness and muscle pain have been reported. In patients at risk for renal dysfunction, persistent or worsening bone or muscle symptoms should be evaluated..     Drug/Food Interactions     ??? Medication list reviewed in Epic. The patient was instructed to inform the care team before taking any new medications or supplements. No drug interactions identified.   ??? Drugs affecting renal function: Coadministration of tenofovir disoproxil fumarate with drugs that are eliminated by active tubular secretion may increase concentrations of tenofovir and/or the coadministered drug. Some examples include, but are not limited to, acyclovir, cidofovir, ganciclovir, valacyclovir, valganciclovir, aminoglycosides (e.g., gentamicin), and high-dose or multiple NSAIDs. Drugs that decrease renal function may increase concentrations of tenofovir.  ??? HBV treatment: avoid combination with adefovir dipivoxil  ??? HIV treatment: Coadministration of tenofovir disoproxil fumarate with certain HIV-1 protease inhibitors increases tenofovir concentrations. Monitor for evidence of tenofovir toxicity.  o Tenofovir disoproxil fumarate increases didanosine concentrations.   o Tenofovir disoproxil fumarate coadministration decreases atazanavir concentrations. When coadministered with tenofovir disoproxil fumarate, use atazanavir given with ritonavir.  ??? HCV treatment: Coadministration of tenofovir disoproxil fumarate with certain HCV medications increase tenofovir concentrations. Monitor for evidence of tenofovir toxicity.  o Ledipasvir/sofosbuvir  o Sofosbuvur/velpatasvir    Storage, Handling Precautions, & Disposal     ??? Store this medication at  room temperature.  ??? Store in the original container.   ??? Keep lid tightly closed.  ??? Store in a dry place. Do not store in a bathroom.  ??? Keep all drugs in a safe place. Keep all drugs out of the reach of children and pets.  Throw away unused or expired drugs. Do not flush down a toilet or pour down a drain unless you are told to do so. Check with your pharmacist if you have questions about the best way to throw out drugs. There may be drug take-back programs in your area.      Current Medications (including OTC/herbals), Comorbidities and Allergies     Current Outpatient Medications   Medication Sig Dispense Refill   ??? cholecalciferol, vitamin D3, (VITAMIN D3 ORAL) Take by mouth. Patient did not know dose (Patient not taking: Reported on 05/05/2021)     ??? magnesium 30 mg tablet Take 30 mg by mouth Two (2) times a day. Patient did not know the strength or salt form (Patient not taking: Reported on 05/05/2021)     ??? phytonadione 100 mcg tablet Take 100 mcg by mouth daily. (Patient not taking: Reported on 05/05/2021)     ??? tenofovir alafenamide (VEMLIDY) 25 mg Tab tablet Take 1 tablet (25 mg total) by mouth daily. (Patient not taking: Reported on 05/05/2021) 90 tablet 3   ??? tenofovir disoproxil (VIREAD) 300 mg tablet Take 1 tablet (300 mg total) by mouth daily. 30 tablet 0   ??? vitamin K2 100 mcg cap Take by mouth. Patient did not know the strength that he takes (Patient not taking: Reported on 05/05/2021)       No current facility-administered medications for this visit.       No Known Allergies    Patient Active Problem List   Diagnosis   ??? Hepatitis B       Reviewed and up to date in Epic.    Appropriateness of Therapy     Acute infections noted within Epic:  No active infections  Patient reported infection: None    Is medication and dose appropriate based on diagnosis and infection status? Yes    Prescription has been clinically reviewed: Yes      Baseline Quality of Life Assessment      How many days over the past month did your Hepatitis B  keep you from your normal activities? For example, brushing your teeth or getting up in the morning. 0    Financial Information     Medication Assistance provided: None Required     Has a 30 day temporary [place] PAP benefit    Anticipated copay of $0.00 reviewed with patient. Verified delivery address.    Delivery Information     Scheduled delivery date: 11/19/21    Expected start date: 11/19/21    Medication will be delivered via UPS to the prescription address in [facility].  This shipment will not require a signature.      Explained the services we provide at [facility] and that each month we would call to set up refills.  Stressed importance of returning phone calls so that we could ensure they receive their medications in time each month.  Informed patient that we should be setting up refills 7-10 days prior to when they will run out of medication.  A pharmacist will reach out to perform a clinical assessment periodically.  Informed patient that a welcome packet, containing information about our pharmacy and other support  services, a Notice of Privacy Practices, and a drug information handout will be sent.      The patient or caregiver noted above participated in the development of this care plan and knows that they can request review of or adjustments to the care plan at any time.      Patient or caregiver verbalized understanding of the above information as well as how to contact the pharmacy at [phone] option 4 with any questions/concerns.  The pharmacy is open Monday through Friday 8:30am-4:30pm.  A pharmacist is available 24/7 via pager to answer any clinical questions they may have.    Patient Specific Needs     - Does the patient have any physical, cognitive, or cultural barriers? No    - Does the patient have adequate living arrangements? (i.e. the ability to store and take their medication appropriately) Yes    - Did you identify any home environmental safety or security hazards? No    - Patient prefers to have medications discussed with  Patient     - Is the patient or caregiver able to read and understand education materials at a high school level or above? Yes    - Patient's primary language is  English     - Is the patient high risk? No        Roderic Palau  [facility] Specialty Pharmacist

## 2021-11-18 NOTE — Unmapped (Addendum)
Hepatology Clinic Pharmacist Note    Primary Hepatologist: Dr. 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   Pharmacy: [facility] [phone] option #4, then option 2    Was informed by [facility] that pt no longer has insurance. Spoke with pt and he report he wanted to change insurance but he never signed up for a new plan. He would like to apply for Manufacturer patient assistance program (MPAP). Advised MPAP would take time to get approved. Pt report he has been out of his medication for 2 days already. Stressed importance of adherence of HBV treatment medication to avoid flare up of HBV. Pt apologizes for not letting us know earlier and said won't let it happen again.    As pt is already out of medication, will switch to Tenofovir disoproxil fumarate 300mg  daily while we await MPAP approval. Obtained temporary PAP for pt and sent Urgent rx to [facility].     Park Breed, Pharm D., BCPS, BCGP, CPP  [facility]  [address]  [place], [place] [address]  [phone]

## 2021-11-20 NOTE — Unmapped (Signed)
Update on delivery of tenofovir disoproxil fumarate 300mg :    Due to problems with the Optifil robot, Steven Nelson tenofovir did not get shipped by UPS.   The [facility] shipped the tenofovir via [employer] to his verified prescription address in [place] on 11/19/21.  I just spoke to him and he confirmed that he did receive his medication yesterday.    Corliss Skains. [place], [place].D.  Specialty Pharmacist  [facility]  [phone] option 4 then option 2

## 2021-11-23 NOTE — Unmapped (Signed)
Update:    Mr. Borgmeyer assistance for Wynonia Sours was just approved.  I called and scheduled delivery of his Vemlidy for 11/25/21 to the verified prescription address in [facility] via UPS.   I told him that it is important to take either the [facility] or the tenofovir but never take both.  I recommended he tuck his tenofovir away in case he needs it in the future due to insurance problems.    Corliss Skains. [place], [place].D.  Specialty Pharmacist  [facility]  [phone] option 4 then option 2

## 2021-11-25 MED FILL — VEMLIDY 25 MG TABLET: ORAL | 30 days supply | Qty: 30 | Fill #7

## 2021-11-25 NOTE — Unmapped (Signed)
Steven Nelson 's Vemildy shipment will be delayed as a result of claim stuck on Vemildy patient assistance.    I have reached out to the patient  at [phone] and communicated the delivery change. We will reschedule the medication for the delivery date that the patient agreed upon.  We have confirmed the delivery date as 1/20, via ups.

## 2021-12-10 DIAGNOSIS — B181 Chronic viral hepatitis B without delta-agent: Principal | ICD-10-CM

## 2021-12-10 MED ORDER — TENOFOVIR DISOPROXIL FUMARATE 300 MG TABLET
ORAL_TABLET | Freq: Every day | ORAL | 0 refills | 30.00000 days
Start: 2021-12-10 — End: 2022-01-09

## 2021-12-10 NOTE — Unmapped (Signed)
[facility] Refill Coordination Note    Specialty Medication(s) to be Shipped:   Infectious Disease: Vemlidy    Other medication(s) to be shipped: No additional medications requested for fill at this time     Steven Nelson, DOB: 1986/05/26  Phone: [phone] (home)       All above HIPAA information was verified with patient.     Was a Nurse, learning disability used for this call? No    Completed refill call assessment today to schedule patient's medication shipment from the [facility] [phone]).  All relevant notes have been reviewed.     Specialty medication(s) and dose(s) confirmed: Patient reports changes to the regimen as follows: No longer on tenofovir disoproxil 300 mg   Changes to medications: Steven Nelson reports stopping the following medications: tenofovir disoproxil 300 mg  Changes to insurance: No  New side effects reported not previously addressed with a pharmacist or physician: None reported  Questions for the pharmacist: No    Confirmed patient received a Conservation officer, historic buildings and a Surveyor, mining with first shipment. The patient will receive a drug information handout for each medication shipped and additional FDA Medication Guides as required.       DISEASE/MEDICATION-SPECIFIC INFORMATION        N/A    SPECIALTY MEDICATION ADHERENCE     Medication Adherence    Patient reported X missed doses in the last month: 0  Specialty Medication: Vemlidy 25mg   Patient is on additional specialty medications: Yes  Additional Specialty Medications: tenofovir disoproxil 300 mg   Patient Reported Additional Medication X Missed Doses in the Last Month: 0  Patient is on more than two specialty medications: No  Informant: patient              Were doses missed due to medication being on hold? No    Vemlidy 25 mg: 14 days of medicine on hand       REFERRAL TO PHARMACIST     Referral to the pharmacist: Not needed      [facility]     Shipping address confirmed in Epic.     Delivery Scheduled: Yes, Expected medication delivery date: 12/21/21.     Medication will be delivered via UPS to the prescription address in [place].    [name]   [facility] Specialty Technician

## 2021-12-20 NOTE — Unmapped (Signed)
[name] 's Vemlidy shipment will be delayed as a result of system timed out which caused a duplicate of paid claim, waiting for [employer] to manually reverse.     I have reached out to the patient  at [phone] and left a voicemail message.  We will call the patient back to reschedule the delivery upon resolution. We have not confirmed the new delivery date.

## 2021-12-21 MED FILL — VEMLIDY 25 MG TABLET: ORAL | 30 days supply | Qty: 30 | Fill #8

## 2021-12-21 NOTE — Unmapped (Signed)
Steven Nelson 's Vemlidy shipment will be sent out  as a result of copay card fixed.    I have reached out to the patient  at [phone] and communicated the delivery change. We will reschedule the medication for the delivery date that the patient agreed upon.  We have confirmed the delivery date as 2/15, via ups.

## 2022-01-13 NOTE — Unmapped (Signed)
[facility] Refill Coordination Note    Specialty Medication(s) to be Shipped:   Infectious Disease: Vemlidy    Other medication(s) to be shipped: No additional medications requested for fill at this time     Steven Nelson, DOB: September 17, 1986  Phone: [phone] (home)       All above HIPAA information was verified with patient.     Was a Nurse, learning disability used for this call? No    Completed refill call assessment today to schedule patient's medication shipment from the [facility] [phone]).  All relevant notes have been reviewed.     Specialty medication(s) and dose(s) confirmed: Regimen is correct and unchanged.   Changes to medications: Steven Nelson reports no changes at this time.  Changes to insurance: No  New side effects reported not previously addressed with a pharmacist or physician: None reported  Questions for the pharmacist: No    Confirmed patient received a Conservation officer, historic buildings and a Surveyor, mining with first shipment. The patient will receive a drug information handout for each medication shipped and additional FDA Medication Guides as required.       DISEASE/MEDICATION-SPECIFIC INFORMATION        N/A    SPECIALTY MEDICATION ADHERENCE     Medication Adherence    Patient reported X missed doses in the last month: 0  Specialty Medication: Vemlidy 25mg   Patient is on additional specialty medications: No        Were doses missed due to medication being on hold? No    Vemlidy 25 mg: 10 days of medicine on hand     REFERRAL TO PHARMACIST     Referral to the pharmacist: Not needed      [facility]     Shipping address confirmed in Epic.     Delivery Scheduled: Yes, Expected medication delivery date: 01/19/2022.     Medication will be delivered via UPS to the prescription address in Epic [place].    Steven Nelson [facility] Specialty Technician

## 2022-01-18 MED FILL — VEMLIDY 25 MG TABLET: ORAL | 30 days supply | Qty: 30 | Fill #9

## 2022-02-17 NOTE — Unmapped (Signed)
[facility] Refill Coordination Note    Specialty Medication(s) to be Shipped:   Infectious Disease: Vemlidy    Other medication(s) to be shipped: No additional medications requested for fill at this time     Jose Corvin, DOB: 04/30/1986  Phone: [phone] (home)       All above HIPAA information was verified with patient.     Was a Nurse, learning disability used for this call? No    Completed refill call assessment today to schedule patient's medication shipment from the [facility] [phone]).  All relevant notes have been reviewed.     Specialty medication(s) and dose(s) confirmed: Regimen is correct and unchanged.   Changes to medications: Javaris reports no changes at this time.  Changes to insurance: No  New side effects reported not previously addressed with a pharmacist or physician: None reported  Questions for the pharmacist: No    Confirmed patient received a Conservation officer, historic buildings and a Surveyor, mining with first shipment. The patient will receive a drug information handout for each medication shipped and additional FDA Medication Guides as required.       DISEASE/MEDICATION-SPECIFIC INFORMATION        N/A    SPECIALTY MEDICATION ADHERENCE     Medication Adherence    Patient reported X missed doses in the last month: 0  Specialty Medication: vemlidy 25mg   Patient is on additional specialty medications: No  Patient is on more than two specialty medications: No  Informant: patient              Were doses missed due to medication being on hold? No    vemlidy 25 mg: 10 days of medicine on hand       REFERRAL TO PHARMACIST     Referral to the pharmacist: Not needed      [facility]     Shipping address confirmed in Epic.     Delivery Scheduled: Yes, Expected medication delivery date: 02/24/22.     Medication will be delivered via UPS to the prescription address in [place].    [name]   [facility] Specialty Technician

## 2022-02-17 NOTE — Unmapped (Signed)
The [facility] has made a second and final attempt to reach this patient to refill the following medication: Vemlidy.      We have left voicemails on the following phone numbers: [phone], have sent a MyChart message and have sent a text message to the following phone numbers: [phone].    Dates contacted: 4/5, 4/13  Last scheduled delivery: shipped 3/14    The patient may be at risk of non-compliance with this medication. The patient should call the [facility] at [phone]  Option 4, then Option 2 (all other specialty patients) to refill medication.    [name]   [facility] Specialty Technician

## 2022-02-23 MED FILL — VEMLIDY 25 MG TABLET: ORAL | 30 days supply | Qty: 30 | Fill #10

## 2022-03-21 MED FILL — VEMLIDY 25 MG TABLET: ORAL | 30 days supply | Qty: 30 | Fill #11

## 2022-03-21 NOTE — Unmapped (Signed)
[facility] Clinical Assessment & Refill Coordination Note    Steven Nelson, DOB: 1986-06-15  Phone: [phone] (home)     All above HIPAA information was verified with patient.     Was a Nurse, learning disability used for this call? No    Specialty Medication(s):   Infectious Disease: Vemlidy     Current Outpatient Medications   Medication Sig Dispense Refill    tenofovir alafenamide (VEMLIDY) 25 mg Tab tablet Take 1 tablet (25 mg total) by mouth daily.   TAKE WITH FOOD 90 tablet 3     No current facility-administered medications for this visit.        Changes to medications: Steven Nelson reports no changes at this time.    No Known Allergies    Changes to allergies: No    SPECIALTY MEDICATION ADHERENCE     Vemlidy 25 mg: approximately 7 days of medicine on hand     Specialty medication(s) dose(s) confirmed: Regimen is correct and unchanged.     Are there any concerns with adherence? No    Adherence counseling provided? Not needed    CLINICAL MANAGEMENT AND INTERVENTION      Clinical Benefit Assessment:    Do you feel the medicine is effective or helping your condition? Yes      HEPATITIS B AND ASSOCIATED LABS:       Lab Results   Component Value Date/Time    HBVDNAQT Not Detected 05/05/2021 09:35 AM    HBVDNAQT Not Detected 10/21/2020 09:52 AM    HBVDNAQT Not Detected 02/19/2020 08:58 AM    HBVDNACP <10 01/21/2016 12:16 PM    HBEAG Negative 10/21/2020 09:52 AM    HBEAG Negative 02/19/2020 08:58 AM    HBEAG Negative 12/20/2018 11:40 AM    HBSAG (A) 10/21/2020 09:52 AM     For final results see Hepatitis B Surface Antigen Neutralization    HBSAG (A) 12/06/2016 09:50 AM     For final results see Hepatitis B Surface Antigen Neutralization    HBSAG (A) 08/16/2016 10:27 AM     For final results see Hepatitis B Surface Antigen Neutralization    HBSAG see below 12/31/2014 11:33 AM    HBSAG see below 02/04/2014 02:00 PM    HBSAG see below 02/28/2013 09:57 AM    HEPBSAB Nonreactive 12/06/2016 09:50 AM HEPBSAB Nonreactive 11/24/2015 10:22 AM    ALT 20 05/05/2021 09:35 AM    ALT 21 10/21/2020 09:52 AM    ALT 22 02/19/2020 08:58 AM    ALT 35 09/28/2015 03:20 PM    ALT 51 12/31/2014 11:33 AM    ALT 45 10/14/2014 10:55 AM    AST 20 05/05/2021 09:35 AM    AST 25 09/28/2015 03:20 PM    CREATININE 0.96 05/05/2021 09:35 AM    CREATININE 0.94 10/21/2020 09:52 AM    CREATININE 0.89 02/19/2020 08:58 AM    CREATININE 0.91 12/31/2014 11:33 AM    CREATININE 0.94 10/14/2014 10:55 AM    CREATININE 1.08 07/22/2014 10:55 AM       Clinical Benefit counseling provided? Labs from 05/05/21 show evidence of clinical benefit    Adverse Effects Assessment:    Are you experiencing any side effects? No    Are you experiencing difficulty administering your medicine? No    Quality of Life Assessment:      How many days over the past month did your Hepatitis B  keep you from your normal activities? For example, brushing your teeth or getting up  in the morning. 0    Have you discussed this with your provider? Not needed    Acute Infection Status:    Acute infections noted within Epic:  No active infections  Patient reported infection: None    Therapy Appropriateness:    Is therapy appropriate and patient progressing towards therapeutic goals? Yes, therapy is appropriate and should be continued    DISEASE/MEDICATION-SPECIFIC INFORMATION      N/A    PATIENT SPECIFIC NEEDS     Does the patient have any physical, cognitive, or cultural barriers? No    Is the patient high risk? No    Does the patient require a Care Management Plan? No         SHIPPING     Specialty Medication(s) to be Shipped:   Infectious Disease: Vemlidy    Other medication(s) to be shipped: No additional medications requested for fill at this time     Changes to insurance: No    Delivery Scheduled: Yes, Expected medication delivery date: 03/22/22.     Medication will be delivered via UPS to the confirmed prescription address in [facility].    The patient will receive a drug information handout for each medication shipped and additional FDA Medication Guides as required.  Verified that patient has previously received a Conservation officer, historic buildings and a Surveyor, mining.    The patient or caregiver noted above participated in the development of this care plan and knows that they can request review of or adjustments to the care plan at any time.      All of the patient's questions and concerns have been addressed.    Roderic Palau   [facility] Specialty Pharmacist

## 2022-04-15 DIAGNOSIS — B181 Chronic viral hepatitis B without delta-agent: Principal | ICD-10-CM

## 2022-04-15 MED ORDER — VEMLIDY 25 MG TABLET
ORAL_TABLET | Freq: Every day | ORAL | 3 refills | 90 days
Start: 2022-04-15 — End: 2023-04-15

## 2022-04-15 NOTE — Unmapped (Signed)
[facility] Refill Coordination Note    Specialty Medication(s) to be Shipped:   Infectious Disease: Vemlidy    Other medication(s) to be shipped: No additional medications requested for fill at this time     Steven Nelson, DOB: 1986-10-02  Phone: [phone] (home)       All above HIPAA information was verified with patient.     Was a Nurse, learning disability used for this call? No    Completed refill call assessment today to schedule patient's medication shipment from the [facility] [phone]).  All relevant notes have been reviewed.     Specialty medication(s) and dose(s) confirmed: Regimen is correct and unchanged.   Changes to medications: [name] reports no changes at this time.  Changes to insurance: No  New side effects reported not previously addressed with a pharmacist or physician: None reported  Questions for the pharmacist: No    Confirmed patient received a Conservation officer, historic buildings and a Surveyor, mining with first shipment. The patient will receive a drug information handout for each medication shipped and additional FDA Medication Guides as required.       DISEASE/MEDICATION-SPECIFIC INFORMATION        N/A    SPECIALTY MEDICATION ADHERENCE     Medication Adherence    Patient reported X missed doses in the last month: 0  Specialty Medication: VEMLIDY 25 mg  Patient is on additional specialty medications: No              Were doses missed due to medication being on hold? No    Vemlidy 25 mg: 7-10 days of medicine on hand        REFERRAL TO PHARMACIST     Referral to the pharmacist: Not needed      [facility]     Shipping address confirmed in Epic.     Delivery Scheduled: Yes, Expected medication delivery date: 04/20/22.  However, Rx request for refills was sent to the provider as there are none remaining.     Medication will be delivered via UPS to the prescription address in Epic [place].    [name]   [facility] Specialty Technician

## 2022-04-17 MED ORDER — VEMLIDY 25 MG TABLET
ORAL_TABLET | Freq: Every day | ORAL | 0 refills | 90 days | Status: CP
Start: 2022-04-17 — End: 2023-04-17
  Filled 2022-04-19: qty 30, 30d supply, fill #0

## 2022-04-18 NOTE — Unmapped (Signed)
Refill request for Vemlidy    LCV  05/05/21    Next visit scheduled for 04/27/22    Will authorize refill at this time

## 2022-04-26 ENCOUNTER — Ambulatory Visit: Admit: 2022-04-26 | Discharge: 2022-04-27

## 2022-05-04 ENCOUNTER — Ambulatory Visit: Admit: 2022-05-04 | Discharge: 2022-05-05 | Attending: Gastroenterology | Primary: Gastroenterology

## 2022-05-04 DIAGNOSIS — B181 Chronic viral hepatitis B without delta-agent: Principal | ICD-10-CM

## 2022-05-04 LAB — COMPREHENSIVE METABOLIC PANEL
ALBUMIN: 4.5 g/dL (ref 3.4–5.0)
ALKALINE PHOSPHATASE: 73 U/L (ref 46–116)
ALT (SGPT): 22 U/L (ref 10–49)
ANION GAP: 12 mmol/L (ref 5–14)
AST (SGOT): 19 U/L (ref <=34)
BILIRUBIN TOTAL: 0.6 mg/dL (ref 0.3–1.2)
BLOOD UREA NITROGEN: 14 mg/dL (ref 9–23)
BUN / CREAT RATIO: 14
CALCIUM: 10 mg/dL (ref 8.7–10.4)
CHLORIDE: 100 mmol/L (ref 98–107)
CO2: 27.1 mmol/L (ref 20.0–31.0)
CREATININE: 1 mg/dL
EGFR CKD-EPI (2021) MALE: >90 mL/min/{1.73_m2} (ref >=60)
GLUCOSE RANDOM: 101 mg/dL — ABNORMAL HIGH (ref 70–99)
POTASSIUM: 3.8 mmol/L (ref 3.4–4.8)
PROTEIN TOTAL: 8.1 g/dL (ref 5.7–8.2)
SODIUM: 139 mmol/L (ref 135–145)

## 2022-05-04 LAB — CBC
HEMATOCRIT: 50.6 % — ABNORMAL HIGH (ref 39.0–48.0)
HEMOGLOBIN: 16.4 g/dL (ref 12.9–16.5)
MEAN CORPUSCULAR HEMOGLOBIN CONC: 32.3 g/dL (ref 32.0–36.0)
MEAN CORPUSCULAR HEMOGLOBIN: 25 pg — ABNORMAL LOW (ref 25.9–32.4)
MEAN CORPUSCULAR VOLUME: 77.2 fL — ABNORMAL LOW (ref 77.6–95.7)
MEAN PLATELET VOLUME: 8.2 fL (ref 6.8–10.7)
PLATELET COUNT: 208 10*9/L (ref 150–450)
RED BLOOD CELL COUNT: 6.56 10*12/L — ABNORMAL HIGH (ref 4.26–5.60)
RED CELL DISTRIBUTION WIDTH: 15.1 % (ref 12.2–15.2)
WBC ADJUSTED: 8.7 10*9/L (ref 3.6–11.2)

## 2022-05-04 LAB — AFP TUMOR MARKER: AFP-TUMOR MARKER: <2 ng/mL (ref <=8)

## 2022-05-04 NOTE — Unmapped (Addendum)
Chronic hepatitis B, doing well on Vemlidy. Continue taking this medication. Liver ultrasound from last week looked fine. Labs today. Return to office in 6 months with liver ultrasound on same day. Please call radiology to schedule the imaging of your liver [phone]

## 2022-05-04 NOTE — Unmapped (Signed)
[facility]      [phone]      Name:Greyden Gramm   UJWJ:[id number]   Age:36 y.o.   Sex:M   Race:Asian       CHIEF COMPLAINT: Study followup after completing HBRN IA protocol. Last visit in 12/2018.     HISTORY OF PRESENT ILLNESS: Mr. Damon is a 36 y.o.  [place] with chronic HBV originally HBeAg + disease. He was in HBV cohort study then transitioned to treatment. His first PEG injection 03/30/12 for immune active treatment trial, randomized PEG and tenofovir PEG reduced to 135 mcg for side effects including weight loss. He completed PEG at week 24. He has continued per protocol on tenofovir alone. He has persistently undetectable HBV DNA although his Hep Be Ag status fluctuates from positive to negative at various times.      Interval history: Patient feels well. Denies N/V abd pain, chest pain, or SOB. Working full time. 100% adherent to [facility] (switched from tenofovir on June 2019).     PAST MEDICAL HISTORY:   1. Chronic hepatitis B, genotype C.   2. Dyslipidemia.   3. Lactose intolerance   4. 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   5. HCC surveillance: 04/2022- No masses  6. HAV immune  7. 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 He works at a [employer]. Does not drink or smoke.    FAMILY HISTORY: There is no family history of hepatocellular carcinoma.     No Known Allergies    Current Outpatient Medications   Medication Sig Dispense Refill   ??? tenofovir alafenamide (VEMLIDY) 25 mg Tab tablet Take 1 tablet (25 mg total) by mouth daily.   TAKE WITH FOOD 90 tablet 0     No current facility-administered medications for this visit.         PHYSICAL EXAMINATION:   BP 119/82  - Pulse 76  - Temp 36.6 ??C (97.8 ??F)  - Ht 165.1 cm (5' 5)  - Wt 63.5 kg (140 lb)  - SpO2 99%  - BMI 23.30 kg/m??         GENERAL: This is a thin [place] male in no acute distress.   Pleasant individual in NAD  HEENT: Sclera are anicteric, no temporal muscle loss  NECK: No thyromegaly or lymphadenopathy, No carotid bruits  Abdomen: Soft, non-tender, non-distended, no hepatosplenomegaly, no masses appreciated, no ascites  Skin: No spider angiomata, No rashes  Extremities: Without pedal edema, no palmar erythema  Neuro: Grossly intact, No focal deficits      Results for orders placed or performed in visit on 05/04/22   Hepatitis B e Antibody   Result Value Ref Range    Hep B E Ab Negative Negative   Hepatitis B e Antigen   Result Value Ref Range    Hep B E Ag Negative Negative   Hepatitis B DNA, Quantitative, PCR   Result Value Ref Range    HBV DNA Quant Not Detected Not Detected   AFP tumor marker   Result Value Ref Range    AFP-Tumor Marker <2 <=8 ng/mL   Comprehensive Metabolic Panel   Result Value Ref Range    Sodium 139 135 - 145 mmol/L    Potassium 3.8 3.4 - 4.8 mmol/L    Chloride 100 98 - 107 mmol/L    CO2 27.1 20.0 - 31.0 mmol/L  Anion Gap 12 5 - 14 mmol/L    BUN 14 9 - 23 mg/dL    Creatinine 2.95 6.21 - 1.10 mg/dL    BUN/Creatinine Ratio 14     eGFR CKD-EPI (2021) Male >90 >=60 mL/min/1.92m2    Glucose 101 (H) 70 - 99 mg/dL    Calcium 30.8 8.7 - 65.7 mg/dL    Albumin 4.5 3.4 - 5.0 g/dL    Total Protein 8.1 5.7 - 8.2 g/dL    Total Bilirubin 0.6 0.3 - 1.2 mg/dL    AST 19 <=84 U/L    ALT 22 10 - 49 U/L    Alkaline Phosphatase 73 46 - 116 U/L   CBC   Result Value Ref Range    WBC 8.7 3.6 - 11.2 10*9/L    RBC 6.56 (H) 4.26 - 5.60 10*12/L    HGB 16.4 12.9 - 16.5 g/dL    HCT 69.6 (H) 29.5 - 48.0 %    MCV 77.2 (L) 77.6 - 95.7 fL    MCH 25.0 (L) 25.9 - 32.4 pg    MCHC 32.3 32.0 - 36.0 g/dL    RDW 28.4 13.2 - 44.0 %    MPV 8.2 6.8 - 10.7 fL    Platelet 208 150 - 450 10*9/L         Impression:    1.  Chronic hepatitis B: Patient was participant in the [facility] immune active clinical trial.  He was treated with tenofovir.  He was switched to [facility] in June 2019.  He continues on this medication.  I reinforced the importance of ongoing follow-up. Labs pending    2. HCC surveillance: U/S 04/2022- negative for masses.     3.  Prior Covid infection: Covid vaccinated and recommended he get booster.     3. 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 HGB electrophoresis confirmed hemoglobin E trait    Patient will return for follow-up in 6 months.  Liver ultrasound around next visit.       Alba Destine, M.D.  Professor of Medicine  Director, [facility]  [facility] [facility] [place] [facility] [place]    [phone]

## 2022-05-05 IMAGING — US US ABDOMEN COMPLETE
1 series · 14 of 25 positions shown · non-contrast
Comparison: Abdominal ultrasound August 14, 2018.

CLINICAL DATA: Hep C screen

EXAM:
ABDOMEN ULTRASOUND COMPLETE

[Series 1: us abdomen complete · 0.15mm/px · 14 of 71 slices shown]
[im 1/71]
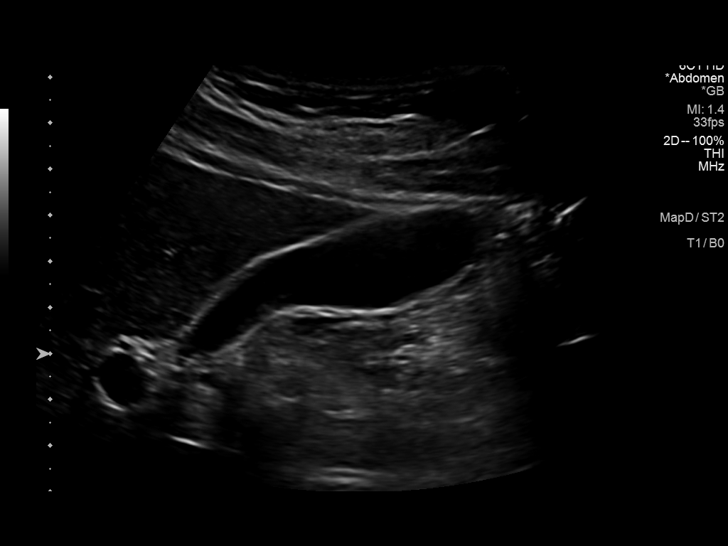
[im 6/71]
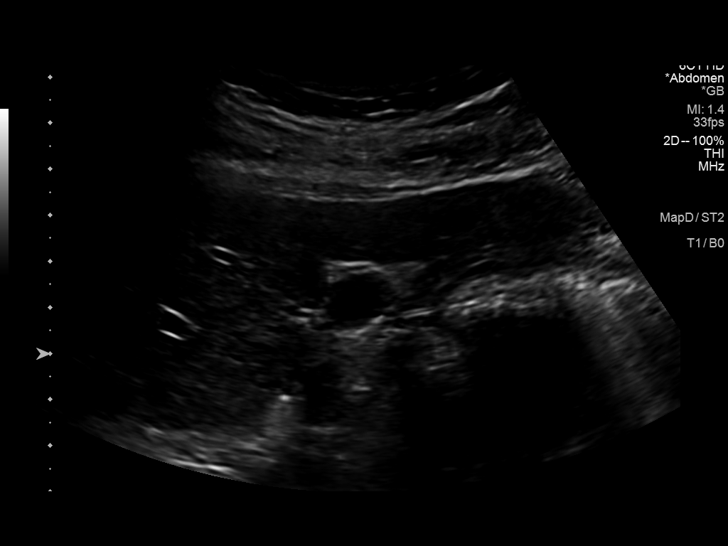
[im 12/71]
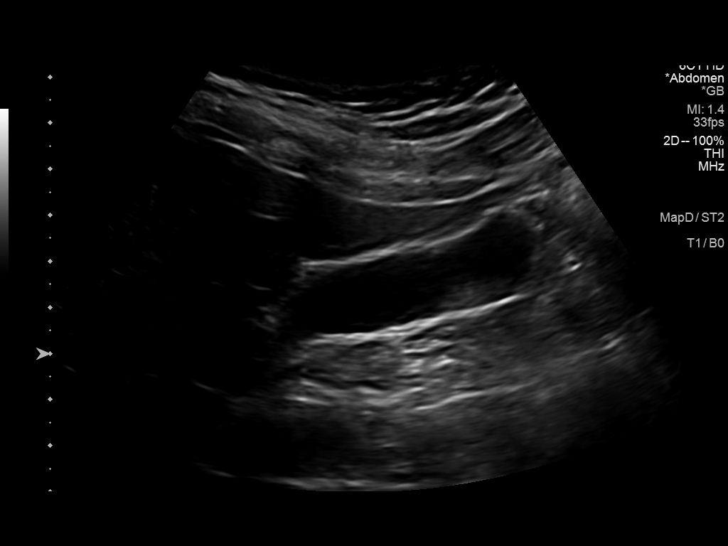
[im 18/71]
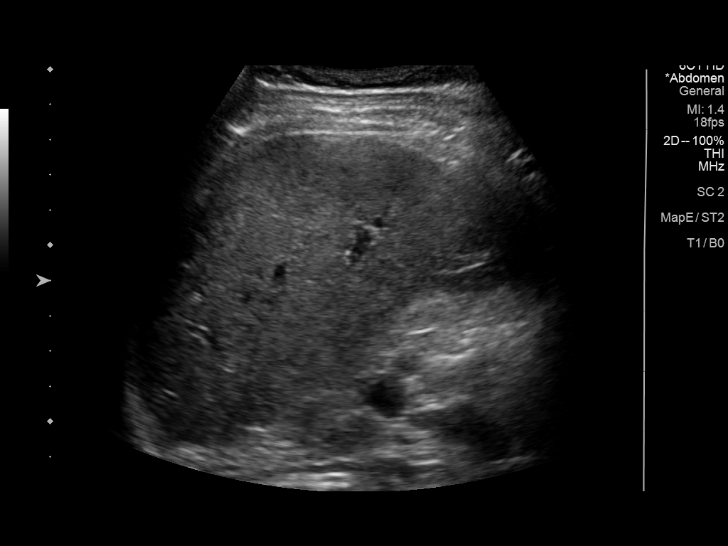
[im 24/71]
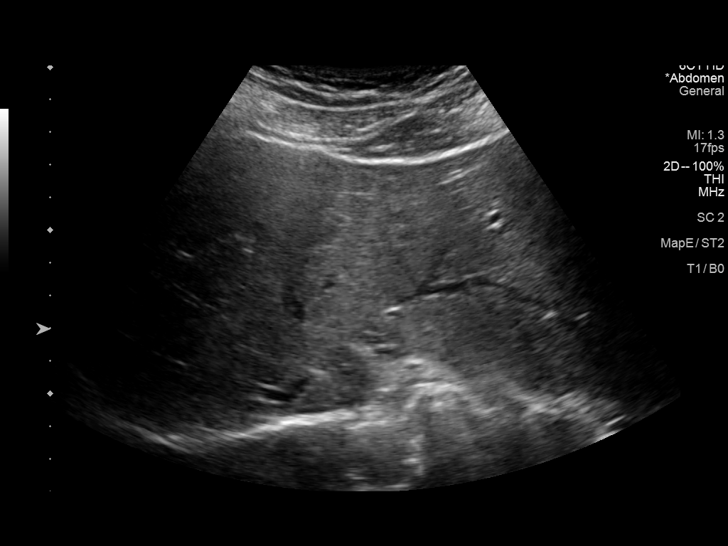
[im 27/71]
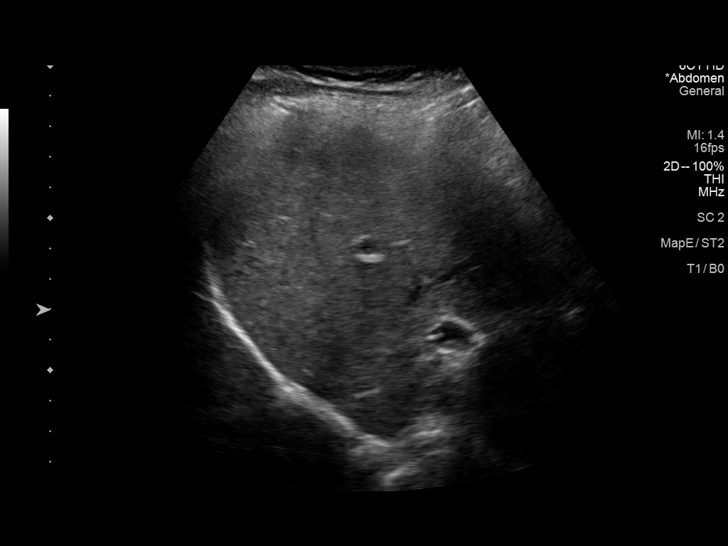
[im 33/71]
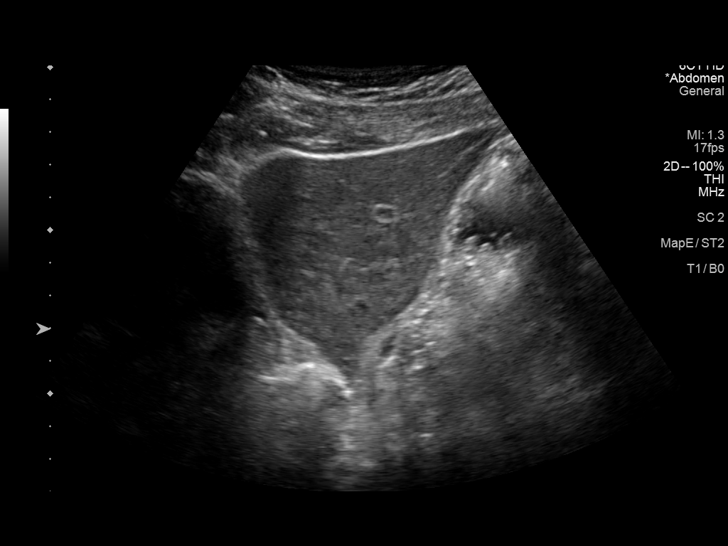
[im 38/71]
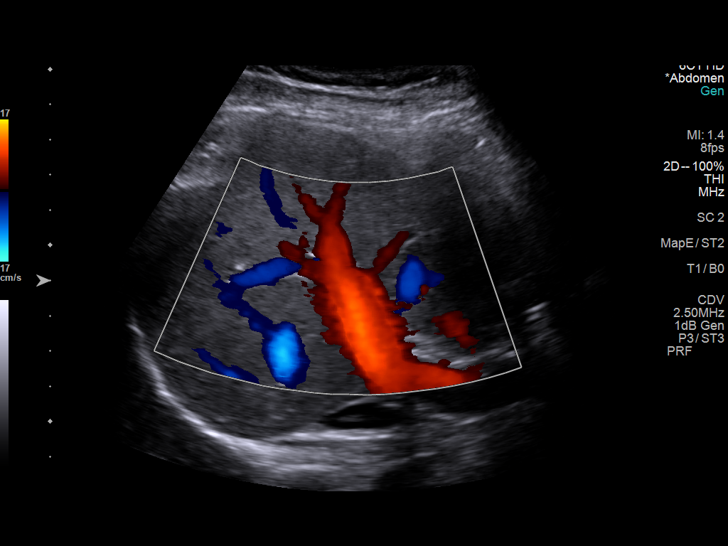
[im 44/71]
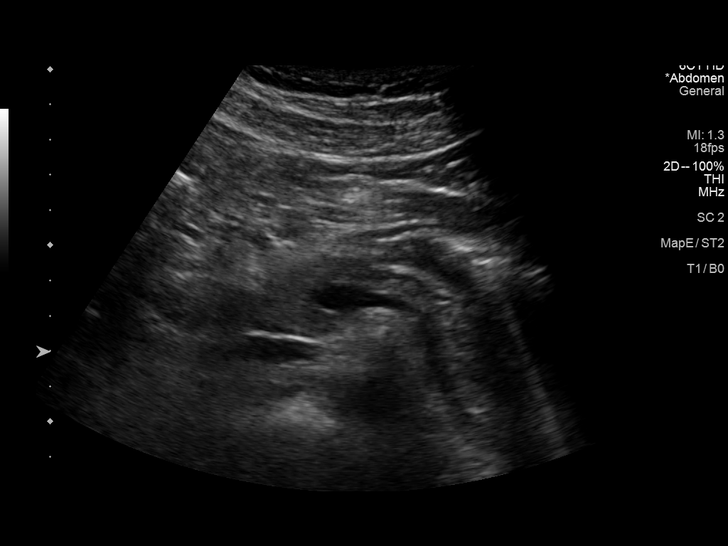
[im 47/71]
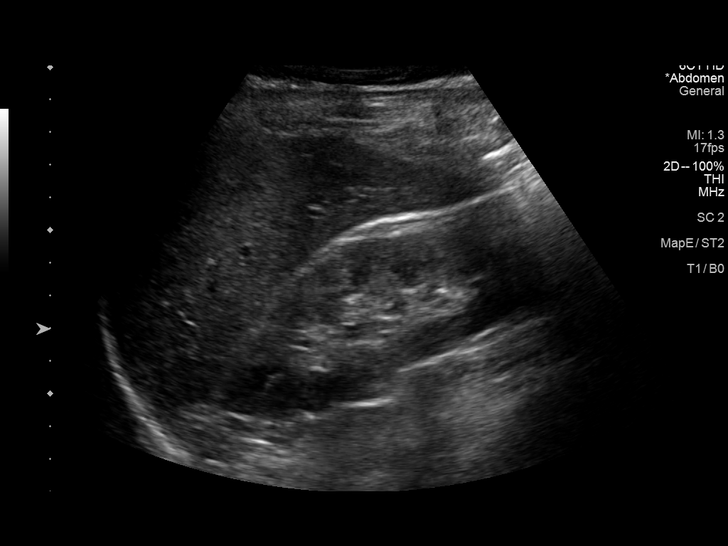
[im 53/71]
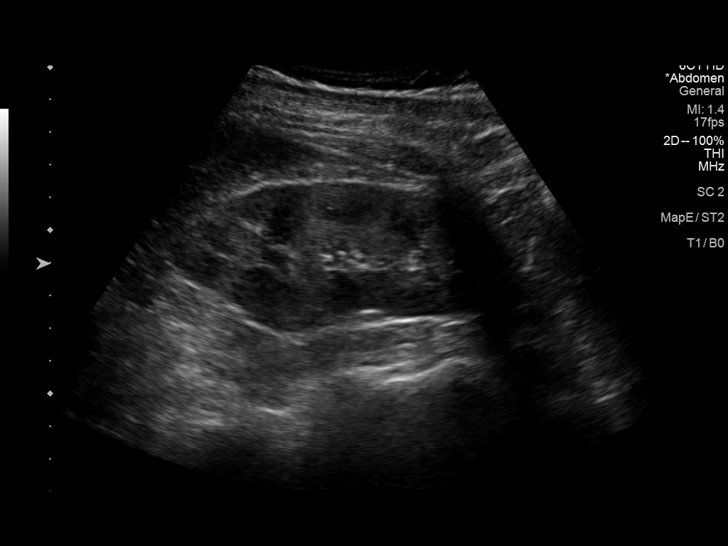
[im 59/71]
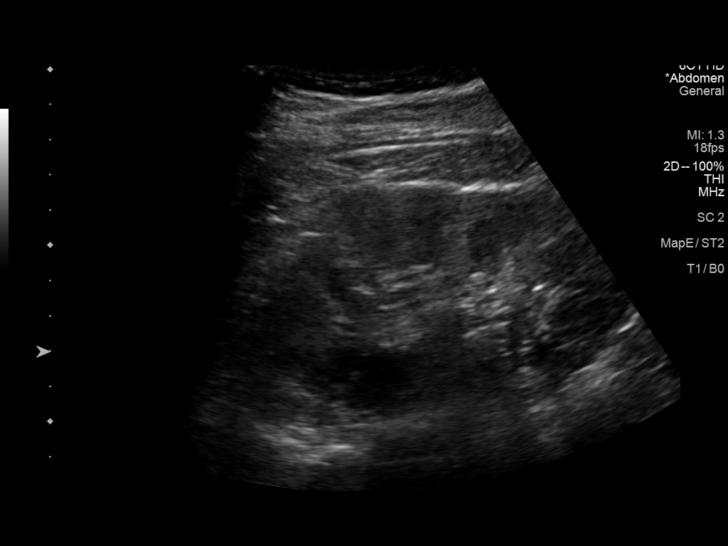
[im 65/71]
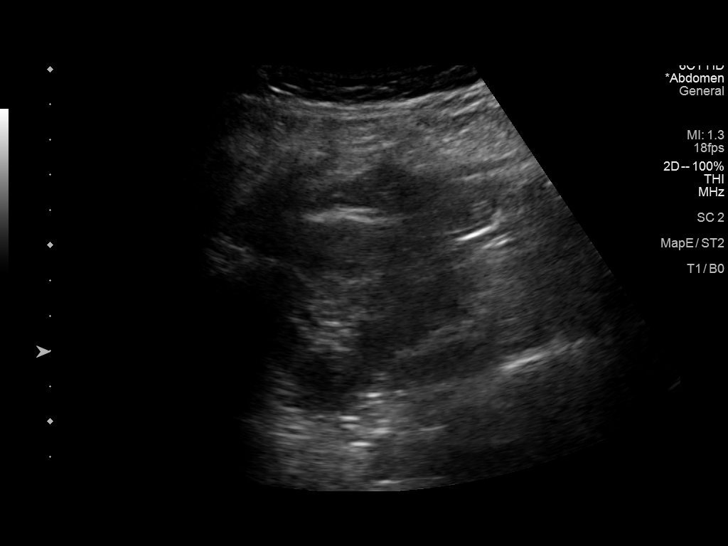
[im 71/71]
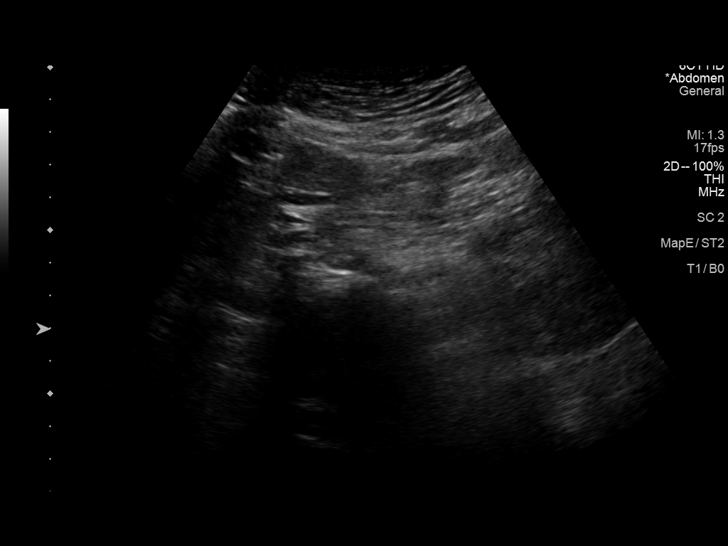

[14 of 25 positions shown; findings below may reference images not displayed]

FINDINGS: Gallbladder: No gallstones or wall thickening visualized. No
sonographic Murphy sign noted by sonographer.

Common bile duct: Diameter: 3 mm

Liver: No focal lesion identified. Within normal limits in
parenchymal echogenicity. Portal vein is patent on color Doppler
imaging with normal direction of blood flow towards the liver.

IVC: No abnormality visualized.

Pancreas: Visualized portion unremarkable.

Spleen: Size and appearance within normal limits.

Right Kidney: Length: 11.1 cm. Echogenicity within normal limits. No
mass or hydronephrosis visualized.

Left Kidney: Length: 10.8 cm. Echogenicity within normal limits. No
mass or hydronephrosis visualized.

Abdominal aorta: No aneurysm visualized.

Other findings: None.
IMPRESSION: Unremarkable ultrasound of the abdomen.

## 2022-05-06 LAB — HEPATITIS B E ANTIBODY: HEPATITIS BE ANTIBODY: NEGATIVE

## 2022-05-06 LAB — HEPATITIS B E ANTIGEN: HEPATITIS BE ANTIGEN: NEGATIVE

## 2022-05-10 LAB — HEPATITIS B DNA, QUANTITATIVE, PCR: HBV DNA QUANT: NOT DETECTED

## 2022-05-19 NOTE — Unmapped (Signed)
The [facility] has made a second and final attempt to reach this patient to refill the following medication: Vemlidy.      We have left voicemails on the following phone numbers: [phone], have sent a MyChart message and have sent a Mychart questionnaire..    Dates contacted: 7/6,7/13  Last scheduled delivery: shipping 6/13    The patient may be at risk of non-compliance with this medication. The patient should call the [facility] at [phone]  Option 4, then Option 2 (all other specialty patients) to refill medication.    [name]   [facility] Specialty Technician

## 2022-05-19 NOTE — Unmapped (Signed)
[facility] Refill Coordination Note    Specialty Medication(s) to be Shipped:   Infectious Disease: Vemlidy    Other medication(s) to be shipped: No additional medications requested for fill at this time     Steven Nelson, DOB: 06/18/1986  Phone: [phone] (home)       All above HIPAA information was verified with patient.     Was a Nurse, learning disability used for this call? No    Completed refill call assessment today to schedule patient's medication shipment from the [facility] [phone]).  All relevant notes have been reviewed.     Specialty medication(s) and dose(s) confirmed: Regimen is correct and unchanged.   Changes to medications: [name] reports no changes at this time.  Changes to insurance: No  New side effects reported not previously addressed with a pharmacist or physician: None reported  Questions for the pharmacist: No    Confirmed patient received a Conservation officer, historic buildings and a Surveyor, mining with first shipment. The patient will receive a drug information handout for each medication shipped and additional FDA Medication Guides as required.       DISEASE/MEDICATION-SPECIFIC INFORMATION        N/A    SPECIALTY MEDICATION ADHERENCE     Medication Adherence    Patient reported X missed doses in the last month: 0  Specialty Medication: vemlidy 25mg   Patient is on additional specialty medications: No  Informant: patient          Were doses missed due to medication being on hold? No    Vemlidy 25 mg: 10 days of medicine on hand        REFERRAL TO PHARMACIST     Referral to the pharmacist: Not needed      [facility]     Shipping address confirmed in Epic.     Delivery Scheduled: Yes, Expected medication delivery date: 05/26/22.     Medication will be delivered via UPS to the prescription address in Epic [place].    Steven Nelson   [facility] Specialty Technician

## 2022-05-25 MED FILL — VEMLIDY 25 MG TABLET: ORAL | 30 days supply | Qty: 30 | Fill #1

## 2022-06-16 NOTE — Unmapped (Signed)
[facility] Refill Coordination Note    Specialty Medication(s) to be Shipped:   Infectious Disease: Vemlidy    Other medication(s) to be shipped: No additional medications requested for fill at this time     Steven Nelson, DOB: Feb 10, 1986  Phone: [phone] (home)       All above HIPAA information was verified with patient.     Was a Nurse, learning disability used for this call? No    Completed refill call assessment today to schedule patient's medication shipment from the [facility] [phone]).  All relevant notes have been reviewed.     Specialty medication(s) and dose(s) confirmed: Regimen is correct and unchanged.   Changes to medications: [name] reports no changes at this time.  Changes to insurance: No  New side effects reported not previously addressed with a pharmacist or physician: None reported  Questions for the pharmacist: No    Confirmed patient received a Conservation officer, historic buildings and a Surveyor, mining with first shipment. The patient will receive a drug information handout for each medication shipped and additional FDA Medication Guides as required.       DISEASE/MEDICATION-SPECIFIC INFORMATION        N/A    SPECIALTY MEDICATION ADHERENCE     Medication Adherence    Patient reported X missed doses in the last month: 0  Specialty Medication: VEMLIDY 25 mg  Patient is on additional specialty medications: No                                Were doses missed due to medication being on hold? No    Vemlidy 25 mg: 10 days of medicine on hand        REFERRAL TO PHARMACIST     Referral to the pharmacist: Not needed      [facility]     Shipping address confirmed in Epic.     Delivery Scheduled: Yes, Expected medication delivery date: 06/22/22.     Medication will be delivered via UPS to the prescription address in Epic [place].    [name]   [facility] Specialty Technician

## 2022-06-21 MED FILL — VEMLIDY 25 MG TABLET: ORAL | 30 days supply | Qty: 30 | Fill #2

## 2022-07-01 NOTE — Unmapped (Signed)
Left VM for patient that he will be due for clinic visit with Dr. Foy Guadalajara and abdominal US in December.  I left the phone number for [facility] and the number for the appointments line. I encouraged the patient to go ahead & call to schedule these things as the schedules fill up quickly towards the end of the year. I also left my phone number if he has any questions.    Ricard Dillon

## 2022-07-21 DIAGNOSIS — B181 Chronic viral hepatitis B without delta-agent: Principal | ICD-10-CM

## 2022-07-21 MED ORDER — VEMLIDY 25 MG TABLET
ORAL_TABLET | Freq: Every day | ORAL | 0 refills | 90 days
Start: 2022-07-21 — End: 2023-07-21

## 2022-07-24 MED ORDER — VEMLIDY 25 MG TABLET
ORAL_TABLET | Freq: Every day | ORAL | 1 refills | 90 days | Status: CP
Start: 2022-07-24 — End: ?
  Filled 2022-07-27: qty 30, 30d supply, fill #0

## 2022-07-24 NOTE — Unmapped (Signed)
Refill request for Vemlidy    LCV 05/04/22 with Dr. Foy Guadalajara    Labs done 05/04/22    Will authorize refill at this time    Lanora Manis, RN

## 2022-07-25 NOTE — Unmapped (Signed)
[facility] Refill Coordination Note    Specialty Medication(s) to be Shipped:   Infectious Disease: Vemlidy    Other medication(s) to be shipped: No additional medications requested for fill at this time     Karanvir Levins, DOB: Mar 19, 1986  Phone: [phone] (home)       All above HIPAA information was verified with patient.     Was a Nurse, learning disability used for this call? No    Completed refill call assessment today to schedule patient's medication shipment from the [facility] [phone]).  All relevant notes have been reviewed.     Specialty medication(s) and dose(s) confirmed: Regimen is correct and unchanged.   Changes to medications: Keshawn reports no changes at this time.  Changes to insurance: No  New side effects reported not previously addressed with a pharmacist or physician: None reported  Questions for the pharmacist: No    Confirmed patient received a Conservation officer, historic buildings and a Surveyor, mining with first shipment. The patient will receive a drug information handout for each medication shipped and additional FDA Medication Guides as required.       DISEASE/MEDICATION-SPECIFIC INFORMATION        N/A    SPECIALTY MEDICATION ADHERENCE     Medication Adherence    Patient reported X missed doses in the last month: 0  Specialty Medication: vemlidy 25mg   Patient is on additional specialty medications: No                                Were doses missed due to medication being on hold? No    Vemlidy 25 mg: 6 days of medicine on hand     REFERRAL TO PHARMACIST     Referral to the pharmacist: Not needed      [facility]     Shipping address confirmed in Epic.     Delivery Scheduled: Yes, Expected medication delivery date: 07/28/22.     Medication will be delivered via UPS to the prescription address in Epic WAM.    Quintella Reichert   [facility] Specialty Technician

## 2022-08-29 NOTE — Unmapped (Signed)
[facility] Refill Coordination Note    Specialty Medication(s) to be Shipped:   Infectious Disease: Vemlidy    Other medication(s) to be shipped: No additional medications requested for fill at this time     Steven Nelson, DOB: Jun 20, 1986  Phone: [phone] (home)       All above HIPAA information was verified with patient.     Was a Nurse, learning disability used for this call? No    Completed refill call assessment today to schedule patient's medication shipment from the [facility] [phone]).  All relevant notes have been reviewed.     Specialty medication(s) and dose(s) confirmed: Regimen is correct and unchanged.   Changes to medications: Steven Nelson reports no changes at this time.  Changes to insurance: No  New side effects reported not previously addressed with a pharmacist or physician: None reported  Questions for the pharmacist: No    Confirmed patient received a Conservation officer, historic buildings and a Surveyor, mining with first shipment. The patient will receive a drug information handout for each medication shipped and additional FDA Medication Guides as required.       DISEASE/MEDICATION-SPECIFIC INFORMATION        N/A    SPECIALTY MEDICATION ADHERENCE     Medication Adherence    Patient reported X missed doses in the last month: 0  Specialty Medication: vemlidy 25mg                           Were doses missed due to medication being on hold? No    vemlidy 25mg   : 0 days of medicine on hand       REFERRAL TO PHARMACIST     Referral to the pharmacist: Not needed      [facility]     Shipping address confirmed in Epic.     Delivery Scheduled: Yes, Expected medication delivery date: 10/25.     Medication will be delivered via UPS to the prescription address in Epic WAM.    Steven Nelson   [facility] Specialty Technician

## 2022-08-30 MED FILL — VEMLIDY 25 MG TABLET: ORAL | 30 days supply | Qty: 30 | Fill #1

## 2022-09-21 NOTE — Unmapped (Signed)
[facility] Refill Coordination Note    Specialty Medication(s) to be Shipped:   Infectious Disease: Vemlidy    Other medication(s) to be shipped: No additional medications requested for fill at this time     Thaine Southers, DOB: 07/26/1986  Phone: [phone] (home)       All above HIPAA information was verified with patient.     Was a Nurse, learning disability used for this call? No    Completed refill call assessment today to schedule patient's medication shipment from the [facility] [phone]).  All relevant notes have been reviewed.     Specialty medication(s) and dose(s) confirmed: Regimen is correct and unchanged.   Changes to medications: Lemarcus reports no changes at this time.  Changes to insurance: No  New side effects reported not previously addressed with a pharmacist or physician: None reported  Questions for the pharmacist: No    Confirmed patient received a Conservation officer, historic buildings and a Surveyor, mining with first shipment. The patient will receive a drug information handout for each medication shipped and additional FDA Medication Guides as required.       DISEASE/MEDICATION-SPECIFIC INFORMATION        N/A    SPECIALTY MEDICATION ADHERENCE     Medication Adherence    Patient reported X missed doses in the last month: 0  Specialty Medication: VEMLIDY 25 mg Tab tablet (tenofovir alafenamide)  Patient is on additional specialty medications: No                                Were doses missed due to medication being on hold? No    vemlidy 25mg   : 10 days of medicine on hand       REFERRAL TO PHARMACIST     Referral to the pharmacist: Not needed      [facility]     Shipping address confirmed in Epic.     Delivery Scheduled: Yes, Expected medication delivery date: 09/26/22.     Medication will be delivered via UPS to the prescription address in Epic W[place].    [place] A Ustin Cruickshank   [facility] Shared [facility] Specialty Technician

## 2022-09-23 MED FILL — VEMLIDY 25 MG TABLET: ORAL | 30 days supply | Qty: 30 | Fill #2

## 2022-10-10 DIAGNOSIS — B181 Chronic viral hepatitis B without delta-agent: Principal | ICD-10-CM

## 2022-10-21 NOTE — Unmapped (Signed)
[facility] Refill Coordination Note    Specialty Medication(s) to be Shipped:   Infectious Disease: Vemlidy    Other medication(s) to be shipped: No additional medications requested for fill at this time     Steven Nelson, DOB: 1986/03/25  Phone: [phone] (home)       All above HIPAA information was verified with patient.     Was a Nurse, learning disability used for this call? No    Completed refill call assessment today to schedule patient's medication shipment from the [facility] [phone]).  All relevant notes have been reviewed.     Specialty medication(s) and dose(s) confirmed: Regimen is correct and unchanged.   Changes to medications: Steven Nelson reports no changes at this time.  Changes to insurance: No  New side effects reported not previously addressed with a pharmacist or physician: None reported  Questions for the pharmacist: No    Confirmed patient received a Conservation officer, historic buildings and a Surveyor, mining with first shipment. The patient will receive a drug information handout for each medication shipped and additional FDA Medication Guides as required.       DISEASE/MEDICATION-SPECIFIC INFORMATION        N/A    SPECIALTY MEDICATION ADHERENCE     Medication Adherence    Patient reported X missed doses in the last month: 0  Specialty Medication: vemlidy 25mg   Patient is on additional specialty medications: No                                Were doses missed due to medication being on hold? No    vemlidy 25mg   : 10 days of medicine on hand       REFERRAL TO PHARMACIST     Referral to the pharmacist: Not needed      [facility]     Shipping address confirmed in Epic.     Delivery Scheduled: Yes, Expected medication delivery date: 10/25/22.     Medication will be delivered via UPS to the prescription address in Epic WAM.    [place] A Nilson Tabora   [facility] Shared [facility] Specialty Technician

## 2022-10-24 MED FILL — VEMLIDY 25 MG TABLET: ORAL | 30 days supply | Qty: 30 | Fill #3

## 2022-11-22 NOTE — Unmapped (Signed)
[facility] Refill Coordination Note    Specialty Medication(s) to be Shipped:   Infectious Disease: Vemlidy    Other medication(s) to be shipped: No additional medications requested for fill at this time     Steven Nelson, DOB: 20-Nov-1985  Phone: [phone] (home)       All above HIPAA information was verified with patient.     Was a Nurse, learning disability used for this call? No    Completed refill call assessment today to schedule patient's medication shipment from the [facility] [phone]).  All relevant notes have been reviewed.     Specialty medication(s) and dose(s) confirmed: Regimen is correct and unchanged.   Changes to medications: Steven Nelson reports no changes at this time.  Changes to insurance: No  New side effects reported not previously addressed with a pharmacist or physician: None reported  Questions for the pharmacist: No    Confirmed patient received a Conservation officer, historic buildings and a Surveyor, mining with first shipment. The patient will receive a drug information handout for each medication shipped and additional FDA Medication Guides as required.       DISEASE/MEDICATION-SPECIFIC INFORMATION        N/A    SPECIALTY MEDICATION ADHERENCE     Medication Adherence    Patient reported X missed doses in the last month: 0  Specialty Medication: Vemlidy 25mg   Patient is on additional specialty medications: No  Patient is on more than two specialty medications: No  Any gaps in refill history greater than 2 weeks in the last 3 months: no  Demonstrates understanding of importance of adherence: yes                                Were doses missed due to medication being on hold? No    vemlidy 25mg   : 7-10 days of medicine on hand       REFERRAL TO PHARMACIST     Referral to the pharmacist: Not needed      [facility]     Shipping address confirmed in Epic.     Delivery Scheduled: Yes, Expected medication delivery date: 11/25/22.     Medication will be delivered via UPS to the prescription address in Epic WAM.    Steven Nelson   [facility] Specialty Technician

## 2022-11-24 NOTE — Unmapped (Signed)
Steven Nelson 's vemlidy shipment will be delayed as a result of MAPs seeking manufacture assistance.     I have reached out to the patient  at [phone] and was unable to leave a message. We will call the patient back to reschedule the delivery upon resolution. We have not confirmed the new delivery date.

## 2022-12-05 MED FILL — VEMLIDY 25 MG TABLET: ORAL | 30 days supply | Qty: 30 | Fill #4

## 2022-12-05 NOTE — Unmapped (Signed)
Steven Nelson 's Vemlidy shipment will be sent out  as a result of mfr assistance renewed.     I have reached out to the patient  at [phone] and communicated the delivery change. We will reschedule the medication for the delivery date that the patient agreed upon.  We have confirmed the delivery date as 1/30, via ups.

## 2022-12-29 NOTE — Unmapped (Signed)
[facility] Refill Coordination Note    Specialty Medication(s) to be Shipped:   Infectious Disease: Vemlidy    Other medication(s) to be shipped: No additional medications requested for fill at this time     Steven Nelson, DOB: 06-21-1986  Phone: [phone] (home)       All above HIPAA information was verified with patient.     Was a Nurse, learning disability used for this call? No    Completed refill call assessment today to schedule patient's medication shipment from the [facility] [phone]).  All relevant notes have been reviewed.     Specialty medication(s) and dose(s) confirmed: Regimen is correct and unchanged.   Changes to medications: Steven Nelson reports no changes at this time.  Changes to insurance: No  New side effects reported not previously addressed with a pharmacist or physician: None reported  Questions for the pharmacist: No    Confirmed patient received a Conservation officer, historic buildings and a Surveyor, mining with first shipment. The patient will receive a drug information handout for each medication shipped and additional FDA Medication Guides as required.       DISEASE/MEDICATION-SPECIFIC INFORMATION        N/A    SPECIALTY MEDICATION ADHERENCE     Medication Adherence    Patient reported X missed doses in the last month: 1  Specialty Medication: vemlidy 25mg   Patient is on additional specialty medications: No  Patient is on more than two specialty medications: No  Any gaps in refill history greater than 2 weeks in the last 3 months: no  Demonstrates understanding of importance of adherence: yes  Informant: patient  Reliability of informant: reliable  Provider-estimated medication adherence level: good  Patient is at risk for Non-Adherence: No  Reasons for non-adherence: no problems identified              Were doses missed due to medication being on hold? No    VEMLIDY 25 mg Tab tablet (tenofovir alafenamide)  : 5 days of medicine on hand       REFERRAL TO PHARMACIST Referral to the pharmacist: Not needed      [facility]     Shipping address confirmed in Epic.     Patient was notified of new phone menu : No    Delivery Scheduled: Yes, Expected medication delivery date: 01/04/23.     Medication will be delivered via UPS to the prescription address in Epic WAM.    Steven Nelson' W Steven Nelson Shared [facility] Specialty Technician

## 2023-01-04 MED FILL — VEMLIDY 25 MG TABLET: ORAL | 30 days supply | Qty: 30 | Fill #5

## 2023-01-25 DIAGNOSIS — B181 Chronic viral hepatitis B without delta-agent: Principal | ICD-10-CM

## 2023-01-25 MED ORDER — VEMLIDY 25 MG TABLET
ORAL_TABLET | Freq: Every day | ORAL | 1 refills | 90 days
Start: 2023-01-25 — End: ?

## 2023-01-25 NOTE — Unmapped (Signed)
[facility] Refill Coordination Note    Specialty Medication(s) to be Shipped:   Infectious Disease: tenofovir disoproxil fumarate    Other medication(s) to be shipped: No additional medications requested for fill at this time     Steven Nelson, DOB: July 26, 1986  Phone: [phone] (home)       All above HIPAA information was verified with patient.     Was a Nurse, learning disability used for this call? No    Completed refill call assessment today to schedule patient's medication shipment from the [facility] [phone]).  All relevant notes have been reviewed.     Specialty medication(s) and dose(s) confirmed: Regimen is correct and unchanged.   Changes to medications: Trelon reports no changes at this time.  Changes to insurance: No  New side effects reported not previously addressed with a pharmacist or physician: None reported  Questions for the pharmacist: No    Confirmed patient received a Conservation officer, historic buildings and a Surveyor, mining with first shipment. The patient will receive a drug information handout for each medication shipped and additional FDA Medication Guides as required.       DISEASE/MEDICATION-SPECIFIC INFORMATION        N/A    SPECIALTY MEDICATION ADHERENCE     Medication Adherence    Patient reported X missed doses in the last month: 0  Specialty Medication: VEMLIDY 25 mg  Patient is on additional specialty medications: No              Were doses missed due to medication being on hold? No    tenofovir 25 mg: 10 days of medicine on hand        REFERRAL TO PHARMACIST     Referral to the pharmacist: Not needed      [facility]     Shipping address confirmed in Epic.     Patient was notified of new phone menu : No    Delivery Scheduled: Yes, Expected medication delivery date: 02/02/23.  However, Rx request for refills was sent to the provider as there are none remaining.     Medication will be delivered via UPS to the prescription address in Epic WAM.    [name]   [facility] Specialty Technician

## 2023-01-28 MED ORDER — VEMLIDY 25 MG TABLET
ORAL_TABLET | Freq: Every day | ORAL | 0 refills | 90 days | Status: CP
Start: 2023-01-28 — End: ?
  Filled 2023-02-01: qty 30, 30d supply, fill #0

## 2023-01-28 NOTE — Unmapped (Signed)
Refill request for Vemlidy    LCV with labs done June 2023    Pt is due for a clinic visit & labs    Will request that the schedulers reach out to him    [employer]

## 2023-01-31 NOTE — Unmapped (Signed)
Hello, pt is scheduled for 09/04 @ 9:00

## 2023-02-23 NOTE — Unmapped (Signed)
[facility] Clinical Assessment & Refill Coordination Note    Steven Nelson, DOB: Oct 12, 1986  Phone: [phone] (home)     All above HIPAA information was verified with patient.     Was a Nurse, learning disability used for this call? No    Specialty Medication(s):   Infectious Disease: Vemlidy     Current Outpatient Medications   Medication Sig Dispense Refill    tenofovir alafenamide (VEMLIDY) 25 mg Tab tablet Take 1 tablet (25 mg total) by mouth daily.   TAKE WITH FOOD 90 tablet 0     No current facility-administered medications for this visit.        Changes to medications: [name] reports no changes at this time.    No Known Allergies    Changes to allergies: No    SPECIALTY MEDICATION ADHERENCE     Vemlidiy 25 mg: approximately 10 days of medicine on hand       Medication Adherence    Patient reported X missed doses in the last month: 0  Specialty Medication: Vemlidy 25mg   Patient is on additional specialty medications: No  Any gaps in refill history greater than 2 weeks in the last 3 months: no  Demonstrates understanding of importance of adherence: yes  Informant: patient  Provider-estimated medication adherence level: good  Patient is at risk for Non-Adherence: No          Specialty medication(s) dose(s) confirmed: Regimen is correct and unchanged.     Are there any concerns with adherence? No    Adherence counseling provided? Not needed    CLINICAL MANAGEMENT AND INTERVENTION      Clinical Benefit Assessment:    Do you feel the medicine is effective or helping your condition? Yes      HEPATITIS B AND ASSOCIATED LABS:       Lab Results   Component Value Date/Time    HBVDNAQT Not Detected 05/04/2022 09:32 AM    HBVDNAQT Not Detected 05/05/2021 09:35 AM    HBVDNAQT Not Detected 10/21/2020 09:52 AM    HBVDNACP <10 01/21/2016 12:16 PM    HBEAG Negative 05/04/2022 09:32 AM    HBEAG Negative 10/21/2020 09:52 AM    HBEAG Negative 02/19/2020 08:58 AM    HBSAG (A) 10/21/2020 09:52 AM     For final results see Hepatitis B Surface Antigen Neutralization    HBSAG (A) 12/06/2016 09:50 AM     For final results see Hepatitis B Surface Antigen Neutralization    HBSAG (A) 08/16/2016 10:27 AM     For final results see Hepatitis B Surface Antigen Neutralization    HBSAG see below 12/31/2014 11:33 AM    HBSAG see below 02/04/2014 02:00 PM    HBSAG see below 02/28/2013 09:57 AM    HEPBSAB Nonreactive 12/06/2016 09:50 AM    HEPBSAB Nonreactive 11/24/2015 10:22 AM    ALT 22 05/04/2022 09:31 AM    ALT 20 05/05/2021 09:35 AM    ALT 21 10/21/2020 09:52 AM    ALT 35 09/28/2015 03:20 PM    ALT 51 12/31/2014 11:33 AM    ALT 45 10/14/2014 10:55 AM    AST 19 05/04/2022 09:31 AM    AST 25 09/28/2015 03:20 PM    CREATININE 1.00 05/04/2022 09:31 AM    CREATININE 0.96 05/05/2021 09:35 AM    CREATININE 0.94 10/21/2020 09:52 AM    CREATININE 0.91 12/31/2014 11:33 AM    CREATININE 0.94 10/14/2014 10:55 AM    CREATININE 1.08 07/22/2014 10:55 AM  Clinical Benefit counseling provided? Labs from 05/04/22 show evidence of clinical benefit    Adverse Effects Assessment:    Are you experiencing any side effects? No    Are you experiencing difficulty administering your medicine? No    Quality of Life Assessment:      How many days over the past month did your Hepatitis B  keep you from your normal activities? For example, brushing your teeth or getting up in the morning. 0    Have you discussed this with your provider? Not needed    Acute Infection Status:    Acute infections noted within Epic:  No active infections  Patient reported infection: None    Therapy Appropriateness:    Is therapy appropriate and patient progressing towards therapeutic goals? Yes, therapy is appropriate and should be continued    DISEASE/MEDICATION-SPECIFIC INFORMATION      N/A    Hepatitis B: Not Applicable    PATIENT SPECIFIC NEEDS     Does the patient have any physical, cognitive, or cultural barriers? No    Is the patient high risk? No    Did the patient require a clinical intervention? No    Does the patient require physician intervention or other additional services (i.e., nutrition, smoking cessation, social work)? No    SOCIAL DETERMINANTS OF HEALTH     At the [facility], we have learned that life circumstances - like trouble affording food, housing, utilities, or transportation can affect the health of many of our patients.   That is why we wanted to ask: are you currently experiencing any life circumstances that are negatively impacting your health and/or quality of life? Patient declined to answer    Social Determinants of Health     Financial Resource Strain: Not on file   Internet Connectivity: Not on file   Food Insecurity: Not on file   Tobacco Use: Low Risk  (05/04/2022)    Patient History     Smoking Tobacco Use: Never     Smokeless Tobacco Use: Never     Passive Exposure: Not on file   Housing/Utilities: Not on file   Alcohol Use: Not on file   Transportation Needs: Not on file   Substance Use: Not on file   Health Literacy: Not on file   Physical Activity: Not on file   Interpersonal Safety: Not on file   Stress: Not on file   Intimate Partner Violence: Not on file   Depression: Not on file   Social Connections: Not on file       Would you be willing to receive help with any of the needs that you have identified today? Not applicable       SHIPPING     Specialty Medication(s) to be Shipped:   Infectious Disease: Vemlidy    Other medication(s) to be shipped: No additional medications requested for fill at this time     Changes to insurance: No    Delivery Scheduled: Yes, Expected medication delivery date: 02/28/23.     Medication will be delivered via UPS to the confirmed prescription address in [facility].    The patient will receive a drug information handout for each medication shipped and additional FDA Medication Guides as required.  Verified that patient has previously received a Conservation officer, historic buildings and a Surveyor, mining.    The patient or caregiver noted above participated in the development of this care plan and knows that they can request review of or adjustments to  the care plan at any time.      All of the patient's questions and concerns have been addressed.    Roderic Palau, PharmD   [facility] Specialty Pharmacist

## 2023-02-27 MED FILL — VEMLIDY 25 MG TABLET: ORAL | 30 days supply | Qty: 30 | Fill #1

## 2023-03-23 NOTE — Unmapped (Signed)
[facility] Refill Coordination Note    Specialty Medication(s) to be Shipped:   Infectious Disease: Vemlidy    Other medication(s) to be shipped: No additional medications requested for fill at this time     Steven Nelson, DOB: June 08, 1986  Phone: [phone] (home)       All above HIPAA information was verified with patient.     Was a Nurse, learning disability used for this call? No    Completed refill call assessment today to schedule patient's medication shipment from the [facility] [phone]).  All relevant notes have been reviewed.     Specialty medication(s) and dose(s) confirmed: Regimen is correct and unchanged.   Changes to medications: [name] reports no changes at this time.  Changes to insurance: No  New side effects reported not previously addressed with a pharmacist or physician: None reported  Questions for the pharmacist: No    Confirmed patient received a Conservation officer, historic buildings and a Surveyor, mining with first shipment. The patient will receive a drug information handout for each medication shipped and additional FDA Medication Guides as required.       DISEASE/MEDICATION-SPECIFIC INFORMATION        N/A    SPECIALTY MEDICATION ADHERENCE     Medication Adherence    Patient reported X missed doses in the last month: 0  Specialty Medication: VEMLIDY 25 mg Tab tablet (tenofovir alafenamide)  Patient is on additional specialty medications: No  Patient is on more than two specialty medications: No  Any gaps in refill history greater than 2 weeks in the last 3 months: no  Demonstrates understanding of importance of adherence: yes  Informant: patient  Confirmed plan for next specialty medication refill: delivery by pharmacy  Refills needed for supportive medications: not needed          Refill Coordination    Has the Patients' Contact Information Changed: No  Is the Shipping Address Different: No         Were doses missed due to medication being on hold? No    VEMLIDY 25   mg: 7 days of medicine on hand     REFERRAL TO PHARMACIST     Referral to the pharmacist: Not needed      [facility]     Shipping address confirmed in Epic.       Delivery Scheduled: Yes, Expected medication delivery date: 03/28/2023.     Medication will be delivered via UPS to the prescription address in Epic WAM.    [name]   [facility] Specialty Technician

## 2023-03-27 MED FILL — VEMLIDY 25 MG TABLET: ORAL | 30 days supply | Qty: 30 | Fill #2

## 2023-04-20 DIAGNOSIS — B181 Chronic viral hepatitis B without delta-agent: Principal | ICD-10-CM

## 2023-04-20 MED ORDER — VEMLIDY 25 MG TABLET
ORAL_TABLET | Freq: Every day | ORAL | 0 refills | 90 days
Start: 2023-04-20 — End: ?

## 2023-04-22 MED ORDER — VEMLIDY 25 MG TABLET
ORAL_TABLET | Freq: Every day | ORAL | 0 refills | 90 days | Status: CP
Start: 2023-04-22 — End: ?
  Filled 2023-05-01: qty 30, 30d supply, fill #0

## 2023-04-22 NOTE — Unmapped (Signed)
Refill request for Vemlidy    LCV 05/04/22 with labs    Pt needs updated labs & a clinic visit--> he has a scheduled clinic appointment on 07/12/23 with Dr. Foy Guadalajara    IB sent to the patient to see where he would like lab orders sent     Will authorize refill x 1 to bridge pt until his clinic appointment

## 2023-04-27 NOTE — Unmapped (Signed)
[facility] Refill Coordination Note    Specialty Medication(s) to be Shipped:   Infectious Disease: Vemlidy    Other medication(s) to be shipped: No additional medications requested for fill at this time     Steven Nelson, DOB: Mar 07, 1986  Phone: [phone] (home)       All above HIPAA information was verified with patient.     Was a Nurse, learning disability used for this call? No    Completed refill call assessment today to schedule patient's medication shipment from the [facility] [phone]).  All relevant notes have been reviewed.     Specialty medication(s) and dose(s) confirmed: Regimen is correct and unchanged.   Changes to medications: Dravin reports no changes at this time.  Changes to insurance: No  New side effects reported not previously addressed with a pharmacist or physician: None reported  Questions for the pharmacist: No    Confirmed patient received a Conservation officer, historic buildings and a Surveyor, mining with first shipment. The patient will receive a drug information handout for each medication shipped and additional FDA Medication Guides as required.       DISEASE/MEDICATION-SPECIFIC INFORMATION        N/A    SPECIALTY MEDICATION ADHERENCE     Medication Adherence    Patient reported X missed doses in the last month: 1  Specialty Medication: VEMLIDY 25 mg Tab tablet (tenofovir alafenamide)  Patient is on additional specialty medications: No  Patient is on more than two specialty medications: No  Any gaps in refill history greater than 2 weeks in the last 3 months: no  Demonstrates understanding of importance of adherence: yes  Informant: patient  Confirmed plan for next specialty medication refill: delivery by pharmacy  Refills needed for supportive medications: not needed          Refill Coordination    Has the Patients' Contact Information Changed: No  Is the Shipping Address Different: No         Were doses missed due to medication being on hold? No    VEMLIDY 25   mg: 2 days of medicine on hand       REFERRAL TO PHARMACIST     Referral to the pharmacist: Not needed      [facility]     Shipping address confirmed in Epic.       Delivery Scheduled: Yes, Expected medication delivery date: 05/02/2023.     Medication will be delivered via UPS to the prescription address in Epic WAM.    [name]   [facility] Specialty Technician

## 2023-05-24 NOTE — Unmapped (Signed)
[facility] Refill Coordination Note    Specialty Medication(s) to be Shipped:   Infectious Disease: Vemlidy    Other medication(s) to be shipped: No additional medications requested for fill at this time     Steven Nelson, DOB: 1986-10-26  Phone: [phone] (home)       All above HIPAA information was verified with patient.     Was a Nurse, learning disability used for this call? No    Completed refill call assessment today to schedule patient's medication shipment from the [facility] [phone]).  All relevant notes have been reviewed.     Specialty medication(s) and dose(s) confirmed: Regimen is correct and unchanged.   Changes to medications: [name] reports no changes at this time.  Changes to insurance: No  New side effects reported not previously addressed with a pharmacist or physician: None reported  Questions for the pharmacist: No    Confirmed patient received a Conservation officer, historic buildings and a Surveyor, mining with first shipment. The patient will receive a drug information handout for each medication shipped and additional FDA Medication Guides as required.       DISEASE/MEDICATION-SPECIFIC INFORMATION        N/A    SPECIALTY MEDICATION ADHERENCE     Medication Adherence    Patient reported X missed doses in the last month: 0  Specialty Medication: VEMLIDY 25 mg Tab tablet (tenofovir alafenamide)  Patient is on additional specialty medications: No  Patient is on more than two specialty medications: No  Any gaps in refill history greater than 2 weeks in the last 3 months: no  Demonstrates understanding of importance of adherence: yes  Informant: patient  Confirmed plan for next specialty medication refill: delivery by pharmacy  Refills needed for supportive medications: not needed          Refill Coordination    Has the Patients' Contact Information Changed: No  Is the Shipping Address Different: No         Were doses missed due to medication being on hold? No    VEMLIDY 25   mg: 7 days of medicine on hand       REFERRAL TO PHARMACIST     Referral to the pharmacist: Not needed      [facility]     Shipping address confirmed in Epic.       Delivery Scheduled: Yes, Expected medication delivery date: 05/30/2023.     Medication will be delivered via UPS to the prescription address in Epic WAM.    [name]   [facility] Specialty Technician

## 2023-05-29 MED FILL — VEMLIDY 25 MG TABLET: ORAL | 30 days supply | Qty: 30 | Fill #1

## 2023-06-20 NOTE — Unmapped (Signed)
[facility] Pharmacy Refill Coordination Note    Specialty Medication(s) to be Shipped:   Infectious Disease: Vemlidy    Other medication(s) to be shipped: No additional medications requested for fill at this time     Steven Nelson, DOB: [birth year]  Phone: [phone] (home)       All above HIPAA information was verified with patient.     Was a Nurse, learning disability used for this call? No    Completed refill call assessment today to schedule patient's medication shipment from the [facility] [phone]).  All relevant notes have been reviewed.     Specialty medication(s) and dose(s) confirmed: Regimen is correct and unchanged.   Changes to medications: Anh reports no changes at this time.  Changes to insurance: No  New side effects reported not previously addressed with a pharmacist or physician: None reported  Questions for the pharmacist: No    Confirmed patient received a Conservation officer, historic buildings and a Surveyor, mining with first shipment. The patient will receive a drug information handout for each medication shipped and additional FDA Medication Guides as required.       DISEASE/MEDICATION-SPECIFIC INFORMATION        N/A    SPECIALTY MEDICATION ADHERENCE     Medication Adherence    Patient reported X missed doses in the last month: 0  Specialty Medication: VEMLIDY 25 mg Tab  Patient is on additional specialty medications: No  Patient is on more than two specialty medications: No  Any gaps in refill history greater than 2 weeks in the last 3 months: no  Demonstrates understanding of importance of adherence: yes                Were doses missed due to medication being on hold? No    VEMLIDY 25   mg: 8  days of medicine on hand       REFERRAL TO PHARMACIST     Referral to the pharmacist: Not needed      [facility]     Shipping address confirmed in Epic.       Delivery Scheduled: Yes, Expected medication delivery date: 06/22/2023.     Medication will be delivered via UPS to the prescription address in Epic WAM.    [name]   [facility] Specialty Technician

## 2023-06-22 MED FILL — VEMLIDY 25 MG TABLET: ORAL | 30 days supply | Qty: 30 | Fill #2

## 2023-07-06 ENCOUNTER — Other Ambulatory Visit: Payer: Self-pay | Admitting: Gastroenterology

## 2023-07-06 DIAGNOSIS — Z1289 Encounter for screening for malignant neoplasm of other sites: Secondary | ICD-10-CM

## 2023-07-06 DIAGNOSIS — B181 Chronic viral hepatitis B without delta-agent: Principal | ICD-10-CM

## 2023-07-06 NOTE — Unmapped (Signed)
VM from patient on 8/28 @ 954 am requesting a call back--no reason given for call    Received voicemail today (8/29) @ 1257 pm stating that he has an upcoming appointment with Dr. Foy Guadalajara & needs an ultrasound order.  States that he will be going to [facility].    Will place the order & give pt the phone number for [facility] scheduling.    Lanora Manis [employer]

## 2023-07-06 NOTE — Unmapped (Signed)
I called the patient to let him  know that the ultrasound order has been placed for [facility]    I let him know that he may not be able to get the ultrasound done next week as these usually book out several months; however, he will call to double check this    He was also open to sending an order to a location in [place]    Will send to [facility] & notify the patient (he stated he uses his My Chart)    Steven Nelson

## 2023-07-06 NOTE — Unmapped (Signed)
Faxed ultrasound order to [facility]

## 2023-07-12 ENCOUNTER — Ambulatory Visit: Admit: 2023-07-12 | Discharge: 2023-07-13 | Attending: Gastroenterology | Primary: Gastroenterology

## 2023-07-12 DIAGNOSIS — B181 Chronic viral hepatitis B without delta-agent: Principal | ICD-10-CM

## 2023-07-12 LAB — COMPREHENSIVE METABOLIC PANEL
ALBUMIN: 4.5 g/dL (ref 3.4–5.0)
ALKALINE PHOSPHATASE: 81 U/L (ref 46–116)
ALT (SGPT): 21 U/L (ref 10–49)
ANION GAP: 9 mmol/L (ref 5–14)
AST (SGOT): 17 U/L (ref <=34)
BILIRUBIN TOTAL: 0.3 mg/dL (ref 0.3–1.2)
BLOOD UREA NITROGEN: 19 mg/dL (ref 9–23)
BUN / CREAT RATIO: 22
CALCIUM: 10.5 mg/dL — ABNORMAL HIGH (ref 8.7–10.4)
CHLORIDE: 104 mmol/L (ref 98–107)
CO2: 27.1 mmol/L (ref 20.0–31.0)
CREATININE: 0.86 mg/dL
EGFR CKD-EPI (2021) MALE: >90 mL/min/{1.73_m2} (ref >=60)
GLUCOSE RANDOM: 87 mg/dL (ref 70–179)
POTASSIUM: 3.8 mmol/L (ref 3.4–4.8)
PROTEIN TOTAL: 8.3 g/dL — ABNORMAL HIGH (ref 5.7–8.2)
SODIUM: 140 mmol/L (ref 135–145)

## 2023-07-12 LAB — CBC
HEMATOCRIT: 49.1 % — ABNORMAL HIGH (ref 39.0–48.0)
HEMOGLOBIN: 15.8 g/dL (ref 12.9–16.5)
MEAN CORPUSCULAR HEMOGLOBIN CONC: 32.2 g/dL (ref 32.0–36.0)
MEAN CORPUSCULAR HEMOGLOBIN: 24.8 pg — ABNORMAL LOW (ref 25.9–32.4)
MEAN CORPUSCULAR VOLUME: 76.9 fL — ABNORMAL LOW (ref 77.6–95.7)
MEAN PLATELET VOLUME: 8.5 fL (ref 6.8–10.7)
PLATELET COUNT: 216 10*9/L (ref 150–450)
RED BLOOD CELL COUNT: 6.38 10*12/L — ABNORMAL HIGH (ref 4.26–5.60)
RED CELL DISTRIBUTION WIDTH: 15.1 % (ref 12.2–15.2)
WBC ADJUSTED: 8.6 10*9/L (ref 3.6–11.2)

## 2023-07-12 LAB — HEPATITIS B DNA, QUANTITATIVE, PCR: HBV DNA QUANT: NOT DETECTED

## 2023-07-12 LAB — AFP TUMOR MARKER: AFP-TUMOR MARKER: <2 ng/mL (ref <=8)

## 2023-07-12 NOTE — Unmapped (Signed)
Chronic hepatitis B. Doing well on Vemlidy. Continue this medication. Labs today. Keep liver ultrasound appointment for tomorrow. Repeat liver ultrasound in 6 months on date of next visit. Please call radiology to schedule the imaging of your liver [phone]

## 2023-07-12 NOTE — Unmapped (Signed)
[facility]    Alba Destine, M.D.  Professor of Medicine  Division of GI and Hepatology  [place] [facility] [place] [facility] [place]    [phone]        Name:Steven Nelson   DDUK:[id number]   Age:37 y.o.   Sex:M   Race:Asian       CHIEF COMPLAINT: Follow-up after completing [facility]BR[facility] IA protocol.     HISTORY OF PRESENT ILLNESS: Mr. Canterbury is a 37 y.o.  [place] with chronic HBV originally HBeAg + disease. He was in HBV cohort study then transitioned to treatment. His first PEG injection 03/30/12 for immune active treatment trial, randomized PEG and tenofovir PEG reduced to 135 mcg for side effects including weight loss. He completed PEG at week 24. He has continued per protocol on tenofovir alone. He has persistently undetectable HBV DNA although his Hep Be Ag status fluctuates from positive to negative at various times.      Interval history: Patient feels well. Denies N/V abd pain, chest pain, or SOB. Working full time. 100% adherent to [facility] (switched from tenofovir on June 2019). Liver ultrasound scheduled for 07/13/2023.    PAST MEDICAL HISTORY:   1. Chronic hepatitis B, genotype C.   2. Dyslipidemia.   3. Lactose intolerance   4. 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   5. HCC surveillance: 07/2023- PENDING  6. HAV immune  7. 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 He works at a [employer]. Does not drink or smoke.    FAMILY HISTORY: There is no family history of hepatocellular carcinoma.     No Known Allergies    Current Outpatient Medications   Medication Sig Dispense Refill    tenofovir alafenamide (VEMLIDY) 25 mg Tab tablet Take 1 tablet (25 mg total) by mouth daily.   TAKE WITH FOOD 90 tablet 0     No current facility-administered medications for this visit.         PHYSICAL EXAMINATION:   BP 127/85  - Pulse 71  - Temp 36.4 ??C (97.6 ??F) (Temporal)  - Ht 167.6 cm (5' 6)  - Wt 64 kg (141 lb)  - SpO2 100%  - BMI 22.76 kg/m??         GENERAL: This is a thin [place] male in no acute distress.   Pleasant individual in NAD  HEENT: Sclera are anicteric, no temporal muscle loss  NECK: No thyromegaly or lymphadenopathy, No carotid bruits  Abdomen: Soft, non-tender, non-distended, no hepatosplenomegaly, no masses appreciated, no ascites  Skin: No spider angiomata, No rashes  Extremities: Without pedal edema, no palmar erythema  Neuro: Grossly intact, No focal deficits      Results for orders placed or performed in visit on 07/12/23   CBC   Result Value Ref Range    WBC 8.6 3.6 - 11.2 10*9/L    RBC 6.38 (H) 4.26 - 5.60 10*12/L    HGB 15.8 12.9 - 16.5 g/dL    HCT 62.8 (H) 31.5 - 48.0 %    MCV 76.9 (L) 77.6 - 95.7 fL    MCH 24.8 (L) 25.9 - 32.4 pg    MCHC 32.2 32.0 - 36.0 g/dL    RDW 17.6 16.0 - 73.7 %    MPV 8.5 6.8 - 10.7 fL    Platelet 216 150 - 450 10*9/L   Comprehensive Metabolic Panel  Result Value Ref Range    Sodium 140 135 - 145 mmol/L    Potassium 3.8 3.4 - 4.8 mmol/L    Chloride 104 98 - 107 mmol/L    CO2 27.1 20.0 - 31.0 mmol/L    Anion Gap 9 5 - 14 mmol/L    BUN 19 9 - 23 mg/dL    Creatinine 2.84 1.32 - 1.18 mg/dL    BUN/Creatinine Ratio 22     eGFR CKD-EPI (2021) Male >90 >=60 mL/min/1.90m2    Glucose 87 70 - 179 mg/dL    Calcium 44.0 (H) 8.7 - 10.4 mg/dL    Albumin 4.5 3.4 - 5.0 g/dL    Total Protein 8.3 (H) 5.7 - 8.2 g/dL    Total Bilirubin 0.3 0.3 - 1.2 mg/dL    AST 17 <=10 U/L    ALT 21 10 - 49 U/L    Alkaline Phosphatase 81 46 - 116 U/L   AFP tumor marker   Result Value Ref Range    AFP-Tumor Marker <2 <=8 ng/mL   Hepatitis B e Antibody   Result Value Ref Range    Hep B E Ab Negative Negative   Hepatitis B e Antigen   Result Value Ref Range    Hep B E Ag Negative Negative   Hepatitis B DNA, Quantitative, PCR   Result Value Ref Range    HBV DNA Quant Not Detected Not Detected   Hepatitis B Surface Antigen   Result Value Ref Range    Hep B Surface Ag (A) Nonreactive     For final results see Hepatitis B Surface Antigen Neutralization   HEP.B NEUTRALIZATION   Result Value Ref Range    Hep B S Ag Neutralization (A) Reactive by screening assay;non-confirmable by neutralization assay     Hepatitis B Surface Antigen POSITIVE;confirmed by neutralization         Impression:    1.  Chronic hepatitis B: Patient was participant in the [facility] immune active clinical trial.  He was treated with tenofovir.  He was switched to [facility] in June 2019.  He continues on this medication.  I reinforced the importance of ongoing follow-up every 6 months. Labs pending    2. HCC surveillance: U/S 07/2023- PENDING. I reinforced importance of q51month ultrasound screening    3. 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 HGB electrophoresis confirmed hemoglobin E trait    Patient will return for follow-up in 6 months.  Liver ultrasound around next visit ordered.       Alba Destine, M.D., FAASLD  Professor of Medicine  Division of GI and Hepatology  [place] [facility] [place] [facility] [place]    [phone]

## 2023-07-13 ENCOUNTER — Other Ambulatory Visit: Payer: BC Managed Care – PPO

## 2023-07-13 ENCOUNTER — Ambulatory Visit
Admission: RE | Admit: 2023-07-13 | Discharge: 2023-07-13 | Disposition: A | Discharge status: Home / Self Care | Payer: No Typology Code available for payment source | Source: Ambulatory Visit | Attending: Gastroenterology | Admitting: Gastroenterology

## 2023-07-13 DIAGNOSIS — Z1289 Encounter for screening for malignant neoplasm of other sites: Secondary | ICD-10-CM

## 2023-07-13 DIAGNOSIS — B181 Chronic viral hepatitis B without delta-agent: Secondary | ICD-10-CM

## 2023-07-14 LAB — HEPATITIS B E ANTIBODY: HEPATITIS BE ANTIBODY: NEGATIVE

## 2023-07-14 LAB — HEPATITIS B E ANTIGEN: HEPATITIS BE ANTIGEN: NEGATIVE

## 2023-07-16 LAB — HEP.B NEUTRALIZATION: HBSAG NEUT: POSITIVE — AB

## 2023-07-16 LAB — HEPATITIS B SURFACE ANTIGEN

## 2023-07-18 DIAGNOSIS — B181 Chronic viral hepatitis B without delta-agent: Principal | ICD-10-CM

## 2023-07-18 MED ORDER — VEMLIDY 25 MG TABLET
ORAL_TABLET | Freq: Every day | ORAL | 0 refills | 90 days
Start: 2023-07-18 — End: ?

## 2023-07-18 NOTE — Unmapped (Signed)
[facility] Refill Coordination Note    Specialty Medication(s) to be Shipped:   Infectious Disease: Vemlidy    Other medication(s) to be shipped: No additional medications requested for fill at this time     Steven Nelson, DOB: [birth year]  Phone: [phone] (home)       All above HIPAA information was verified with patient.     Was a Nurse, learning disability used for this call? No    Completed refill call assessment today to schedule patient's medication shipment from the [facility] [phone]).  All relevant notes have been reviewed.     Specialty medication(s) and dose(s) confirmed: Regimen is correct and unchanged.   Changes to medications: Kameel reports no changes at this time.  Changes to insurance: No  New side effects reported not previously addressed with a pharmacist or physician: None reported  Questions for the pharmacist: No    Confirmed patient received a Conservation officer, historic buildings and a Surveyor, mining with first shipment. The patient will receive a drug information handout for each medication shipped and additional FDA Medication Guides as required.       DISEASE/MEDICATION-SPECIFIC INFORMATION        N/A    SPECIALTY MEDICATION ADHERENCE     Medication Adherence    Patient reported X missed doses in the last month: 0  Specialty Medication: VEMLIDY 25 mg Tab tablet (tenofovir alafenamide)  Patient is on additional specialty medications: No  Patient is on more than two specialty medications: No  Any gaps in refill history greater than 2 weeks in the last 3 months: no  Demonstrates understanding of importance of adherence: yes  Informant: patient  Confirmed plan for next specialty medication refill: delivery by pharmacy  Refills needed for supportive medications: yes, ordered or provider notified          Refill Coordination    Has the Patients' Contact Information Changed: No  Is the Shipping Address Different: No         Were doses missed due to medication being on hold? No    VEMLIDY 25   mg: 7 days of medicine on hand       REFERRAL TO PHARMACIST     Referral to the pharmacist: Not needed      [facility]     Shipping address confirmed in Epic.       Delivery Scheduled: Yes, Expected medication delivery date: 07/25/2023.  However, Rx request for refills was sent to the provider as there are none remaining.     Medication will be delivered via UPS to the prescription address in Epic WAM.    [name]   [facility] Specialty Technician

## 2023-07-19 MED ORDER — VEMLIDY 25 MG TABLET
ORAL_TABLET | Freq: Every day | ORAL | 2 refills | 90 days | Status: CP
Start: 2023-07-19 — End: ?
  Filled 2023-07-24: qty 30, 30d supply, fill #0

## 2023-07-20 NOTE — Unmapped (Signed)
Refill request for Vemlidy    LCV 07/12/23 with labs    Will authorize refill at this time    [facility]

## 2023-08-15 NOTE — Unmapped (Signed)
[facility] and [facility] Delivery Pharm[facility] Refill Coordination Note    Specialty Medication(s) to be Shipped:   Infectious Disease: Vemlidy    Other medication(s) to be shipped: No additional medications requested for fill at this time     Steven Nelson, DOB: 02/22/1986  Phone: [phone] (home)       All above HIPAA information was verified with patient.     Was a Nurse, learning disability used for this call? No    Completed refill call assessment today to schedule patient's medication shipment from the [facility] and [facility] Delivery Pharmacy  [phone]).  All relevant notes have been reviewed.     Specialty medication(s) and dose(s) confirmed: Regimen is correct and unchanged.   Changes to medications: Steven Nelson reports no changes at this time.  Changes to insurance: No  New side effects reported not previously addressed with a pharmacist or physician: None reported  Questions for the pharmacist: No    Confirmed patient received a Conservation officer, historic buildings and a Surveyor, mining with first shipment. The patient will receive a drug information handout for each medication shipped and additional FDA Medication Guides as required.       DISEASE/MEDICATION-SPECIFIC INFORMATION        N/A    SPECIALTY MEDICATION ADHERENCE     Medication Adherence    Patient reported X missed doses in the last month: 1  Specialty Medication: VEMLIDY 25 mg Tab tablet (tenofovir alafenamide)  Patient is on additional specialty medications: No  Patient is on more than two specialty medications: No  Any gaps in refill history greater than 2 weeks in the last 3 months: no  Demonstrates understanding of importance of adherence: yes  Informant: patient  Confirmed plan for next specialty medication refill: delivery by pharmacy  Refills needed for supportive medications: not needed          Refill Coordination    Has the Patients' Contact Information Changed: No  Is the Shipping Address Different: No         Were doses missed due to medication being on hold? No    VEMLIDY 25  mg: 10 days of medicine on hand       REFERRAL TO PHARMACIST     Referral to the pharmacist: Not needed      [facility]     Shipping address confirmed in Epic.       Delivery Scheduled: Yes, Expected medication delivery date: 08/25/2023.     Medication will be delivered via UPS to the prescription address in Epic WAM.    Steven Nelson   [facility] Delivery Pharm[facility]  Specialty Technician

## 2023-08-24 MED FILL — VEMLIDY 25 MG TABLET: ORAL | 30 days supply | Qty: 30 | Fill #1

## 2023-09-21 NOTE — Unmapped (Signed)
[facility] Special[facility] and Home Delivery Pharmacy Refill Coordination Note    Specialty Medication(s) to be Shipped:   Infectious Disease: Vemlidy    Other medication(s) to be shipped: No additional medications requested for fill at this time     Steven Nelson, DOB: 1985/11/15  Phone: [phone] (home)       All above HIPAA information was verified with patient.     Was a Nurse, learning disability used for this call? No    Completed refill call assessment today to schedule patient's medication shipment from the [facility] and [facility] Delivery Pharmacy  [phone]).  All relevant notes have been reviewed.     Specialty medication(s) and dose(s) confirmed: Regimen is correct and unchanged.   Changes to medications: Steven Nelson reports no changes at this time.  Changes to insurance: No  New side effects reported not previously addressed with a pharmacist or physician: None reported  Questions for the pharmacist: No    Confirmed patient received a Conservation officer, historic buildings and a Surveyor, mining with first shipment. The patient will receive a drug information handout for each medication shipped and additional FDA Medication Guides as required.       DISEASE/MEDICATION-SPECIFIC INFORMATION        N/A    SPECIALTY MEDICATION ADHERENCE     Medication Adherence    Patient reported X missed doses in the last month: 0  Specialty Medication: VEMLIDY 25 mg Tab tablet (tenofovir alafenamide)  Patient is on additional specialty medications: No  Patient is on more than two specialty medications: No  Any gaps in refill history greater than 2 weeks in the last 3 months: no  Demonstrates understanding of importance of adherence: yes  Informant: patient  Confirmed plan for next specialty medication refill: delivery by pharmacy  Refills needed for supportive medications: not needed          Refill Coordination    Has the Patients' Contact Information Changed: No  Is the Shipping Address Different: No         Were doses missed due to medication being on hold? No    VEMLIDY 25  mg: 7 days of medicine on hand       REFERRAL TO PHARMACIST     Referral to the pharmacist: Not needed      [facility]     Shipping address confirmed in Epic.       Delivery Scheduled: Yes, Expected medication delivery date: 09/26/23.     Medication will be delivered via UPS to the prescription address in Epic WAM.    [name]   [facility] Special[facility] and Home Delivery Pharm[facility]  Specialty Technician

## 2023-09-25 MED FILL — VEMLIDY 25 MG TABLET: ORAL | 30 days supply | Qty: 30 | Fill #2

## 2023-10-19 NOTE — Unmapped (Signed)
[facility] Specialty and Home Delivery Pharmacy Refill Coordination Note    Specialty Medication(s) to be Shipped:   Infectious Disease: Vemlidy    Other medication(s) to be shipped: No additional medications requested for fill at this time     Steven Nelson, DOB: Apr 19, 1986  Phone: [phone] (home)       All above HIPAA information was verified with patient.     Was a Nurse, learning disability used for this call? No    Completed refill call assessment today to schedule patient's medication shipment from the [facility] and [facility] Delivery Pharmacy  [phone]).  All relevant notes have been reviewed.     Specialty medication(s) and dose(s) confirmed: Regimen is correct and unchanged.   Changes to medications: Awad reports no changes at this time.  Changes to insurance: No  New side effects reported not previously addressed with a pharmacist or physician: None reported  Questions for the pharmacist: No    Confirmed patient received a Conservation officer, historic buildings and a Surveyor, mining with first shipment. The patient will receive a drug information handout for each medication shipped and additional FDA Medication Guides as required.       DISEASE/MEDICATION-SPECIFIC INFORMATION        N/A    SPECIALTY MEDICATION ADHERENCE     Medication Adherence    Patient reported X missed doses in the last month: 0  Specialty Medication: VEMLIDY 25 mg Tab tablet (tenofovir alafenamide)  Patient is on additional specialty medications: No  Patient is on more than two specialty medications: No  Any gaps in refill history greater than 2 weeks in the last 3 months: no  Demonstrates understanding of importance of adherence: yes  Informant: patient  Confirmed plan for next specialty medication refill: delivery by pharmacy  Refills needed for supportive medications: not needed          Refill Coordination    Has the Patients' Contact Information Changed: No  Is the Shipping Address Different: No         Were doses missed due to medication being on hold? No    VEMLIDY 25   mg: 7 days of medicine on hand       REFERRAL TO PHARMACIST     Referral to the pharmacist: Not needed      [facility]     Shipping address confirmed in Epic.       Delivery Scheduled: Yes, Expected medication delivery date: 10/24/23.     Medication will be delivered via UPS to the prescription address in Epic WAM.    [name]   [facility] and Home Delivery Pharm[facility]  Specialty Technician

## 2023-10-23 MED FILL — VEMLIDY 25 MG TABLET: ORAL | 30 days supply | Qty: 30 | Fill #3

## 2023-11-20 NOTE — Unmapped (Signed)
[facility] Special[facility] and Home Delivery Pharmacy Refill Coordination Note    Specialty Medication(s) to be Shipped:   Infectious Disease: Vemlidy    Other medication(s) to be shipped: No additional medications requested for fill at this time     Delquan Huibregtse, DOB: 11-06-1986  Phone: [phone] (home)       All above HIPAA information was verified with patient.     Was a Nurse, learning disability used for this call? No    Completed refill call assessment today to schedule patient's medication shipment from the [facility] and [facility] Delivery Pharmacy  [phone]).  All relevant notes have been reviewed.     Specialty medication(s) and dose(s) confirmed: Regimen is correct and unchanged.   Changes to medications: Osa reports no changes at this time.  Changes to insurance: No  New side effects reported not previously addressed with a pharmacist or physician: None reported  Questions for the pharmacist: No    Confirmed patient received a Conservation officer, historic buildings and a Surveyor, mining with first shipment. The patient will receive a drug information handout for each medication shipped and additional FDA Medication Guides as required.       DISEASE/MEDICATION-SPECIFIC INFORMATION        N/A    SPECIALTY MEDICATION ADHERENCE     Medication Adherence    Patient reported X missed doses in the last month: 0  Specialty Medication: Vemlidy 25mg  daily  Patient is on additional specialty medications: No  Patient is on more than two specialty medications: No  Any gaps in refill history greater than 2 weeks in the last 3 months: no  Demonstrates understanding of importance of adherence: yes  Informant: patient            Were doses missed due to medication being on hold? No    Vemlidy 25 mg: 10 days of medicine on hand     REFERRAL TO PHARMACIST     Referral to the pharmacist: Not needed      [facility]     Shipping address confirmed in Epic.       Delivery Scheduled: Yes, Expected medication delivery date: 11/24/23.     Medication will be delivered via UPS to the prescription address in Epic WAM.    Oliva Bustard, PharmD   [facility] and Home Delivery Pharm[facility]  Specialty Pharmacist

## 2023-11-21 DIAGNOSIS — B181 Chronic viral hepatitis B without delta-agent: Principal | ICD-10-CM

## 2023-11-23 NOTE — Unmapped (Addendum)
Steven Nelson 's Vemlidy shipment will be delayed as a result of a high copay.     I have spoken with the patient  at [phone]  and communicated the delay. We will call the patient back to reschedule the delivery upon resolution. We have not confirmed the new delivery date.

## 2023-11-28 MED FILL — VEMLIDY 25 MG TABLET: ORAL | 30 days supply | Qty: 30 | Fill #4

## 2023-11-28 NOTE — Unmapped (Signed)
Annett Gula 's Vemlidy shipment will be sent out as a result of copay is now approved by patient/caregiver.      I have spoken with the patient  at [phone]  and communicated the delivery change. We will reschedule the medication for the delivery date that the patient agreed upon.  We have confirmed the delivery date as 11/29/23, via ups.

## 2023-12-20 NOTE — Unmapped (Signed)
[facility] Specialty and Home Delivery Pharmacy Refill Coordination Note    Specialty Medication(s) to be Shipped:   Infectious Disease: Vemlidy    Other medication(s) to be shipped: No additional medications requested for fill at this time     Steven Nelson, DOB: Jun 07, 1986  Phone: [phone] (home)       All above HIPAA information was verified with patient.     Was a Nurse, learning disability used for this call? No    Completed refill call assessment today to schedule patient's medication shipment from the [facility] and Home Delivery Pharmacy  [phone]).  All relevant notes have been reviewed.     Specialty medication(s) and dose(s) confirmed: Regimen is correct and unchanged.   Changes to medications: Zaahir reports no changes at this time.  Changes to insurance: No  New side effects reported not previously addressed with a pharmacist or physician: None reported  Questions for the pharmacist: No    Confirmed patient received a Conservation officer, historic buildings and a Surveyor, mining with first shipment. The patient will receive a drug information handout for each medication shipped and additional FDA Medication Guides as required.       DISEASE/MEDICATION-SPECIFIC INFORMATION        N/A    SPECIALTY MEDICATION ADHERENCE     Medication Adherence    Patient reported X missed doses in the last month: 0  Specialty Medication: VEMLIDY 25 mg Tab tablet (tenofovir alafenamide)  Patient is on additional specialty medications: No  Patient is on more than two specialty medications: No  Any gaps in refill history greater than 2 weeks in the last 3 months: no  Demonstrates understanding of importance of adherence: yes  Informant: patient  Confirmed plan for next specialty medication refill: delivery by pharmacy  Refills needed for supportive medications: not needed          Refill Coordination    Has the Patients' Contact Information Changed: No  Is the Shipping Address Different: No         Were doses missed due to medication being on hold? No    VEMLIDY 25   mg: 10 days of medicine on hand       REFERRAL TO PHARMACIST     Referral to the pharmacist: Not needed      [facility]     Shipping address confirmed in Epic.       Delivery Scheduled: Yes, Expected medication delivery date: 12/28/23.     Medication will be delivered via UPS to the prescription address in Epic WAM.    [name]   [facility] Specialty and Home Delivery Pharmacy  Specialty Technician

## 2023-12-27 MED FILL — VEMLIDY 25 MG TABLET: ORAL | 30 days supply | Qty: 30 | Fill #5

## 2024-01-18 NOTE — Unmapped (Signed)
 [facility] Specialty and Home Delivery Pharmacy Clinical Assessment & Refill Coordination Note    Steven Nelson, DOB: [birth year]  Phone: [phone] (home)     All above HIPAA information was verified with patient.     Was a Nurse, learning disability used for this call? No    Specialty Medication(s):   Infectious Disease: Vemlidy     Current Outpatient Medications   Medication Sig Dispense Refill    tenofovir alafenamide (VEMLIDY) 25 mg Tab tablet Take 1 tablet (25 mg total) by mouth daily.   TAKE WITH FOOD 90 tablet 2     No current facility-administered medications for this visit.        Changes to medications: Kawan reports no changes at this time.    Medication list has been reviewed and updated in Epic: Yes    No Known Allergies    Changes to allergies: No    Allergies have been reviewed and updated in Epic: Yes    SPECIALTY MEDICATION ADHERENCE     Vemlidy 25mg   : 10 days of medicine on hand       Medication Adherence    Patient reported X missed doses in the last month: 0  Specialty Medication: Vemlidy 25mg  QD  Patient is on additional specialty medications: No          Specialty medication(s) dose(s) confirmed: Regimen is correct and unchanged.     Are there any concerns with adherence? No    Adherence counseling provided? Not needed    CLINICAL MANAGEMENT AND INTERVENTION      Clinical Benefit Assessment:    Do you feel the medicine is effective or helping your condition? Yes    Clinical Benefit counseling provided? Not needed    Adverse Effects Assessment:    Are you experiencing any side effects? No    Are you experiencing difficulty administering your medicine? No    Quality of Life Assessment:    Quality of Life    Rheumatology  Oncology  Dermatology  Cystic Fibrosis          How many days over the past month did your transplant  keep you from your normal activities? For example, brushing your teeth or getting up in the morning. 0    Have you discussed this with your provider? Not needed    Acute Infection Status:    Acute infections noted within Epic:  No active infections    Patient reported infection: None    Therapy Appropriateness:    Is therapy appropriate based on current medication list, adverse reactions, adherence, clinical benefit and progress toward achieving therapeutic goals? Yes, therapy is appropriate and should be continued     Clinical Intervention:    Was an intervention completed as part of this clinical assessment? No    DISEASE/MEDICATION-SPECIFIC INFORMATION      N/A    Solid Organ Transplant: Not Applicable    PATIENT SPECIFIC NEEDS     Does the patient have any physical, cognitive, or cultural barriers? No    Is the patient high risk? No    Does the patient require physician intervention or other additional services (i.e., nutrition, smoking cessation, social work)? No    Does the patient have an additional or emergency contact listed in their chart? Yes    SOCIAL DETERMINANTS OF HEALTH     At the [facility], we have learned that life circumstances - like trouble affording food, housing, utilities, or transportation can affect the health of many of our  patients.   That is why we wanted to ask: are you currently experiencing any life circumstances that are negatively impacting your health and/or quality of life? Patient declined to answer    Social Drivers of Health     Food Insecurity: Not on file   Tobacco Use: Low Risk  (07/12/2023)    Patient History     Smoking Tobacco Use: Never     Smokeless Tobacco Use: Never     Passive Exposure: Not on file   Transportation Needs: Not on file   Alcohol Use: Not on file   Housing: Not on file   Physical Activity: Not on file   Utilities: Not on file   Stress: Not on file   Interpersonal Safety: Not on file   Substance Use: Not on file (09/17/2023)   Intimate Partner Violence: Not on file   Social Connections: Not on file   Financial Resource Strain: Not on file   Depression: Not at risk (08/02/2020)    Received from [facility] visits prior to 01/07/2023.    PHQ-2     SDOH PHQ2 SCORE: 0   Internet Connectivity: Not on file   Health Literacy: Not on file       Would you be willing to receive help with any of the needs that you have identified today? Not applicable       SHIPPING     Specialty Medication(s) to be Shipped:   Infectious Disease: Vemlidy    Other medication(s) to be shipped: No additional medications requested for fill at this time     Changes to insurance: No    Cost and Payment: Patient has a $0 copay, payment information is not required. From previous fill, unable to test on scheduling as rts but patient says no changes should still be $0. Note added in work order    Delivery Scheduled: Yes, Expected medication delivery date: 01/24/2024.     Medication will be delivered via UPS to the confirmed prescription address in [facility].    The patient will receive a drug information handout for each medication shipped and additional FDA Medication Guides as required.  Verified that patient has previously received a Conservation officer, historic buildings and a Surveyor, mining.    The patient or caregiver noted above participated in the development of this care plan and knows that they can request review of or adjustments to the care plan at any time.      All of the patient's questions and concerns have been addressed.    Thad Ranger, PharmD   [facility] Specialty and Home Delivery Pharmacy Specialty Pharmacist

## 2024-01-23 MED FILL — VEMLIDY 25 MG TABLET: ORAL | 30 days supply | Qty: 30 | Fill #6

## 2024-01-24 ENCOUNTER — Ambulatory Visit: Admit: 2024-01-24 | Discharge: 2024-01-25 | Attending: Gastroenterology | Primary: Gastroenterology

## 2024-01-24 DIAGNOSIS — B181 Chronic viral hepatitis B without delta-agent: Principal | ICD-10-CM

## 2024-01-24 LAB — COMPREHENSIVE METABOLIC PANEL
ALBUMIN: 4.3 g/dL (ref 3.4–5.0)
ALKALINE PHOSPHATASE: 72 U/L (ref 46–116)
ALT (SGPT): 25 U/L (ref 10–49)
ANION GAP: 11 mmol/L (ref 5–14)
AST (SGOT): 20 U/L (ref <=34)
BILIRUBIN TOTAL: 0.4 mg/dL (ref 0.3–1.2)
BLOOD UREA NITROGEN: 16 mg/dL (ref 9–23)
BUN / CREAT RATIO: 19
CALCIUM: 9.8 mg/dL (ref 8.7–10.4)
CHLORIDE: 101 mmol/L (ref 98–107)
CO2: 28.6 mmol/L (ref 20.0–31.0)
CREATININE: 0.84 mg/dL (ref 0.73–1.18)
EGFR CKD-EPI (2021) MALE: >90 mL/min/{1.73_m2} (ref >=60)
GLUCOSE RANDOM: 124 mg/dL (ref 70–179)
POTASSIUM: 3.6 mmol/L (ref 3.4–4.8)
PROTEIN TOTAL: 8 g/dL (ref 5.7–8.2)
SODIUM: 141 mmol/L (ref 135–145)

## 2024-01-24 LAB — HEPATITIS B DNA, QUANTITATIVE, PCR
HBV DNA QUANT: DETECTED — AB
HBV DNA: <10 [IU]/mL — ABNORMAL HIGH (ref <=0)

## 2024-01-24 LAB — CBC
HEMATOCRIT: 50.3 % — ABNORMAL HIGH (ref 39.0–48.0)
HEMOGLOBIN: 16.3 g/dL (ref 12.9–16.5)
MEAN CORPUSCULAR HEMOGLOBIN CONC: 32.4 g/dL (ref 32.0–36.0)
MEAN CORPUSCULAR HEMOGLOBIN: 25.5 pg — ABNORMAL LOW (ref 25.9–32.4)
MEAN CORPUSCULAR VOLUME: 78.7 fL (ref 77.6–95.7)
MEAN PLATELET VOLUME: 8.4 fL (ref 6.8–10.7)
PLATELET COUNT: 206 10*9/L (ref 150–450)
RED BLOOD CELL COUNT: 6.4 10*12/L — ABNORMAL HIGH (ref 4.26–5.60)
RED CELL DISTRIBUTION WIDTH: 14.8 % (ref 12.2–15.2)
WBC ADJUSTED: 7.2 10*9/L (ref 3.6–11.2)

## 2024-01-24 LAB — AFP TUMOR MARKER: AFP-TUMOR MARKER: <2 ng/mL (ref <=8)

## 2024-01-24 NOTE — Unmapped (Signed)
 [facility]    Alba Destine, M.D.  Professor of Medicine  Division of GI and Hepatology  [place] [facility] [place] [facility] [place]    [phone]        Name:Azekiel Simerson   UJWJ:[id number]   Age:38 y.o.   Sex:M   Race:Asian       CHIEF COMPLAINT: Follow-up after completing HBRN IA protocol.     HISTORY OF PRESENT ILLNESS: Mr. Rochford is a 38 y.o.  [place] with chronic HBV originally HBeAg + disease. He was in HBV cohort study then transitioned to treatment. His first PEG injection 03/30/12 for immune active treatment trial, randomized PEG and tenofovir PEG reduced to 135 mcg for side effects including weight loss. He completed PEG at week 24. He has continued per protocol on tenofovir alone. He has persistently undetectable HBV DNA although his Hep Be Ag status fluctuates from positive to negative at various times.      Interval history: Patient feels well. Denies N/V abd pain, chest pain, or SOB. Working full time. 100% adherent to [facility] (switched from tenofovir on June 2019).   Going to [place] in April to get engaged.    PAST MEDICAL HISTORY:   1. Chronic hepatitis B, genotype C.   2. Dyslipidemia.   3. Lactose intolerance   4. 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   5. HCC surveillance: 07/2023- No masses.  REPEAT PENDING at [facility]  6. HAV immune  7. 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 He works at a [employer]. Does not drink or smoke.    FAMILY HISTORY: There is no family history of hepatocellular carcinoma.     No Known Allergies    Current Outpatient Medications   Medication Sig Dispense Refill    tenofovir alafenamide (VEMLIDY) 25 mg Tab tablet Take 1 tablet (25 mg total) by mouth daily.   TAKE WITH FOOD 90 tablet 2     No current facility-administered medications for this visit.         PHYSICAL EXAMINATION:   BP 117/78 (BP Site: L Arm, BP Position: Sitting, BP Cuff Size: Medium)  - Pulse 83  - Temp 36.1 ??C (97 ??F) (Temporal)  - Ht 168 cm (5' 6.14)  - Wt 67.6 kg (149 lb)  - SpO2 100%  - BMI 23.95 kg/m??         GENERAL: This is a thin [place] male in no acute distress.   Pleasant individual in NAD  HEENT: Sclera are anicteric, no temporal muscle loss  NECK: No thyromegaly or lymphadenopathy, No carotid bruits  Abdomen: Soft, non-tender, non-distended, no hepatosplenomegaly, no masses appreciated, no ascites  Skin: No spider angiomata, No rashes  Extremities: Without pedal edema, no palmar erythema  Neuro: Grossly intact, No focal deficits      Results for orders placed or performed in visit on 01/24/24   Hepatitis B DNA, Quantitative, PCR   Result Value Ref Range    HBV DNA Quant Detected (A) Not Detected    HBV DNA <10 (H) 0 IU/mL    HBV DNA Log10     AFP tumor marker   Result Value Ref Range    AFP-Tumor Marker <2 <=8 ng/mL   Comprehensive Metabolic Panel   Result Value Ref Range    Sodium 141 135 - 145 mmol/L    Potassium 3.6 3.4 -  4.8 mmol/L    Chloride 101 98 - 107 mmol/L    CO2 28.6 20.0 - 31.0 mmol/L    Anion Gap 11 5 - 14 mmol/L    BUN 16 9 - 23 mg/dL    Creatinine 0.98 1.19 - 1.18 mg/dL    BUN/Creatinine Ratio 19     eGFR CKD-EPI (2021) Male >90 >=60 mL/min/1.52m2    Glucose 124 70 - 179 mg/dL    Calcium 9.8 8.7 - 14.7 mg/dL    Albumin 4.3 3.4 - 5.0 g/dL    Total Protein 8.0 5.7 - 8.2 g/dL    Total Bilirubin 0.4 0.3 - 1.2 mg/dL    AST 20 <=82 U/L    ALT 25 10 - 49 U/L    Alkaline Phosphatase 72 46 - 116 U/L   CBC   Result Value Ref Range    WBC 7.2 3.6 - 11.2 10*9/L    RBC 6.40 (H) 4.26 - 5.60 10*12/L    HGB 16.3 12.9 - 16.5 g/dL    HCT 95.6 (H) 21.3 - 48.0 %    MCV 78.7 77.6 - 95.7 fL    MCH 25.5 (L) 25.9 - 32.4 pg    MCHC 32.4 32.0 - 36.0 g/dL    RDW 08.6 57.8 - 46.9 %    MPV 8.4 6.8 - 10.7 fL    Platelet 206 150 - 450 10*9/L         Impression:    1.  Chronic hepatitis B: Patient was participant in the [facility] immune active clinical trial.  He was treated with tenofovir.  He was switched to [facility] in June 2019.  He continues on this medication.  I reinforced the importance of ongoing follow-up every 6 months.     2. HCC surveillance: U/S 07/2023- No masses. I reinforced importance of q48month ultrasound screening. Repeat ordered for [facility] imaging.    3. 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 HGB electrophoresis confirmed hemoglobin E trait.    4. Pending engagement: Steffanie Rainwater is from [place]. She should be tested for HBsAg, anti-HBcore, anti-HBs to determine expsoure or vaccination status.     Patient will return for follow-up in 6 months.        Alba Destine, M.D., FAASLD  Professor of Medicine  Division of GI and Hepatology  [place] [facility] [place] at [place]    [phone]

## 2024-01-24 NOTE — Unmapped (Addendum)
 Chronic hepatitis B, Doing well on Vemlidy. Continue this medication. Labs today. Liver ultrasound ordered for [facility] imaging. Please call to schedule. You fiancee should have the following tests:   HBsAg, anti-HBs, anti-HBcore. These will allow her doctors to determine if she needs the hepatitis B vaccine or if she has been previously exposed to hepatitis B.  You should establish care with primary physician in [place]. Return to office in 6 months.

## 2024-02-13 NOTE — Unmapped (Signed)
 [facility] Specialty and Home Delivery Pharmacy Refill Coordination Note    Specialty Medication(s) to be Shipped:   Infectious Disease: Vemlidy    Other medication(s) to be shipped: No additional medications requested for fill at this time     Steven Nelson, DOB: Mar 13, 1986  Phone: [phone] (home)       All above HIPAA information was verified with patient.     Was a Nurse, learning disability used for this call? No    Completed refill call assessment today to schedule patient's medication shipment from the [facility] and Home Delivery Pharmacy  [phone]).  All relevant notes have been reviewed.     Specialty medication(s) and dose(s) confirmed: Regimen is correct and unchanged.   Changes to medications: Ramelo reports no changes at this time.  Changes to insurance: No  New side effects reported not previously addressed with a pharmacist or physician: None reported  Questions for the pharmacist: No    Confirmed patient received a Conservation officer, historic buildings and a Surveyor, mining with first shipment. The patient will receive a drug information handout for each medication shipped and additional FDA Medication Guides as required.       DISEASE/MEDICATION-SPECIFIC INFORMATION        N/A    SPECIALTY MEDICATION ADHERENCE     Medication Adherence    Specialty Medication: tenofovir alafenamide: VEMLIDY 25 mg Tab tablet  Patient is on additional specialty medications: No              Were doses missed due to medication being on hold? No       tenofovir alafenamide: VEMLIDY 25 mg Tab tablet: 7 days of medicine on hand       REFERRAL TO PHARMACIST     Referral to the pharmacist: Not needed      [facility]     Shipping address confirmed in Epic.     Cost and Payment: Patient has a $0 copay, payment information is not required.    Delivery Scheduled: Yes, Expected medication delivery date: 02/20/2024.     Medication will be delivered via UPS to the prescription address in Epic WAM.    Lanny Plan   [facility] Specialty and Home Delivery Pharmacy  Specialty Technician

## 2024-02-19 MED FILL — VEMLIDY 25 MG TABLET: ORAL | 30 days supply | Qty: 30 | Fill #7

## 2024-03-25 NOTE — Unmapped (Signed)
 The [facility] has made a second and final attempt to reach this patient to refill the following medication:VEMLIDY 25 mg Tab tablet (tenofovir alafenamide).      We have left voicemails on the following phone numbers: [phone], have sent a MyChart message, and have sent a text message to the following phone numbers: [phone].    Dates contacted: 03/11/2024 and 03/25/2024  Last scheduled delivery: 02/19/2024    The patient may be at risk of non-compliance with this medication. The patient should call the [facility] at [phone]  Option 4, then Option 4: Infectious Disease, Transplant to refill medication.    Lanny Plan   [facility] Specialty and Home Delivery Oncologist

## 2024-04-02 DIAGNOSIS — B181 Chronic viral hepatitis B without delta-agent: Principal | ICD-10-CM

## 2024-04-04 NOTE — Unmapped (Signed)
  Pharmacy - Enhanced Care Program  Reason for Call: Medication Access; Type: SSC Refill Call  Referral Request: Hepatology - Clarence Croak, CPP    Summary of Telephone Encounter  The following referral was received by Clarence Croak, CPP: Final attempt from [facility] for [facility] refill coordination. Please check on adherence then warm transfer to [facility] for refill coordination. Thanks.   Last scheduled delivery was 02/19/2024.  I called the patient today @ [phone].  He stated that he has been out of town in [place] and had to buy his medication while over there so he still has 2 weeks left of his fill from 02/19/2024.      Follow-Up:  Will notify Clarence Croak, CPP and close referral.    Call Attempt #: 1  Time Spent on Referral: 10 minutes  Number of Days Spent on Referral: 1    Holland Lundborg, Pharm.D.  Pharmacy Department  [facility]  [address]  [place], [place] [address]   [phone]

## 2024-04-12 NOTE — Unmapped (Addendum)
 [facility] Specialty and Home Delivery Pharmacy Refill Coordination Note    Specialty Medication(s) to be Shipped:   Infectious Disease: Vemlidy    Other medication(s) to be shipped: No additional medications requested for fill at this time     Steven Nelson, DOB: [birth year]  Phone: [phone] (home)       All above HIPAA information was verified with patient.     Was a Nurse, learning disability used for this call? No    Completed refill call assessment today to schedule patient's medication shipment from the [facility] and [facility] Delivery Pharm[facility]  [phone]).  All relevant notes have been reviewed.     Specialty medication(s) and dose(s) confirmed: Regimen is correct and unchanged.   Changes to medications: [name] reports no changes at this time.  Changes to insurance: No  New side effects reported not previously addressed with a pharmacist or physician: None reported  Questions for the pharmacist: No    Confirmed patient received a Conservation officer, historic buildings and a Surveyor, mining with first shipment. The patient will receive a drug information handout for each medication shipped and additional FDA Medication Guides as required.       DISEASE/MEDICATION-SPECIFIC INFORMATION        N/A    SPECIALTY MEDICATION ADHERENCE     Medication Adherence    Patient reported X missed doses in the last month: 3  Specialty Medication: VEMLIDY 25 mg Tab tablet (tenofovir alafenamide)  Patient is on additional specialty medications: No        This pharmacist was notified by that this patient has reported that they've missed 3 doses of their vemlidy.. I have reviewed the patient's medical record and counseled on importance of med adherence      Approximate time spent: 0-5 minutes      Were doses missed due to medication being on hold? No    Vemlidy 25 mg: 7 days of medicine on hand       REFERRAL TO PHARMACIST     Referral to the pharmacist: Not needed      [facility]     Shipping address confirmed in Epic.     Cost and Payment: Patient has a $0 copay, payment information is not required.    Delivery Scheduled: Yes, Expected medication delivery date: 04/17/24.     Medication will be delivered via UPS to the prescription address in Epic WAM.    Steven Nelson, PharmD   [facility] Specialty and Home Delivery Pharmacy  Specialty Pharmacist

## 2024-04-16 MED FILL — VEMLIDY 25 MG TABLET: ORAL | 30 days supply | Qty: 30 | Fill #8

## 2024-05-07 DIAGNOSIS — B181 Chronic viral hepatitis B without delta-agent: Principal | ICD-10-CM

## 2024-05-07 MED ORDER — VEMLIDY 25 MG TABLET
ORAL_TABLET | Freq: Every day | ORAL | 2 refills | 90.00000 days
Start: 2024-05-07 — End: ?

## 2024-05-07 NOTE — Unmapped (Signed)
 [facility] Specialty and Home Delivery Pharmacy Refill Coordination Note    Specialty Medication(s) to be Shipped:   Infectious Disease: Vemlidy     Other medication(s) to be shipped: No additional medications requested for fill at this time     Steven Nelson, DOB: Sep 03, 1986  Phone: [phone] (home)       All above HIPAA information was verified with patient.     Was a Nurse, learning disability used for this call? No    Completed refill call assessment today to schedule patient's medication shipment from the [facility] and Home Delivery Pharmacy  [phone]).  All relevant notes have been reviewed.     Specialty medication(s) and dose(s) confirmed: Regimen is correct and unchanged.   Changes to medications: Auden reports no changes at this time.  Changes to insurance: No  New side effects reported not previously addressed with a pharmacist or physician: None reported  Questions for the pharmacist: No    Confirmed patient received a Conservation officer, historic buildings and a Surveyor, mining with first shipment. The patient will receive a drug information handout for each medication shipped and additional FDA Medication Guides as required.       DISEASE/MEDICATION-SPECIFIC INFORMATION        N/A    SPECIALTY MEDICATION ADHERENCE     Medication Adherence    Patient reported X missed doses in the last month: 0  Specialty Medication: VEMLIDY  25 mg Tab tablet (tenofovir  alafenamide)  Patient is on additional specialty medications: No              Were doses missed due to medication being on hold? No     VEMLIDY  25 mg Tab tablet (tenofovir  alafenamide): 14 days of medicine on hand       REFERRAL TO PHARMACIST     Referral to the pharmacist: Not needed      [facility]     Shipping address confirmed in Epic.     Cost and Payment: Patient has a $0 copay, payment information is not required.    Delivery Scheduled: Yes, Expected medication delivery date: 05/16/2024.     Medication will be delivered via UPS to the prescription address in Epic WAM.    Steven Nelson   [facility] Specialty and Home Delivery Pharmacy  Specialty Technician

## 2024-05-08 ENCOUNTER — Other Ambulatory Visit: Payer: Self-pay | Admitting: Gastroenterology

## 2024-05-08 DIAGNOSIS — B181 Chronic viral hepatitis B without delta-agent: Secondary | ICD-10-CM

## 2024-05-08 MED ORDER — VEMLIDY 25 MG TABLET
ORAL_TABLET | Freq: Every day | ORAL | 1 refills | 90.00000 days | Status: CP
Start: 2024-05-08 — End: ?
  Filled 2024-05-15: qty 30, 30d supply, fill #0

## 2024-05-08 NOTE — Unmapped (Signed)
 Refill request for Vemlidy     Last clinic visit 01/24/24 with labs    Next clinic visit scheduled for 08/07/24    Will authorize refills at this time

## 2024-05-08 NOTE — Unmapped (Signed)
 Ultrasound order and patient facesheet faxed to [facility], fax numbers [phone] and [phone]    No PA required (Self-pay)

## 2024-06-04 NOTE — Unmapped (Signed)
 [facility] Specialty and Home Delivery Pharmacy Refill Coordination Note    Specialty Medication(s) to be Shipped:   Infectious Disease: Vemlidy     Other medication(s) to be shipped: No additional medications requested for fill at this time     Steven Nelson, DOB: Feb 10, 1986  Phone: [phone] (home)       All above HIPAA information was verified with patient.     Was a Nurse, learning disability used for this call? No    Completed refill call assessment today to schedule patient's medication shipment from the [facility] and Home Delivery Pharmacy  [phone]).  All relevant notes have been reviewed.     Specialty medication(s) and dose(s) confirmed: Regimen is correct and unchanged.   Changes to medications: Lamond reports no changes at this time.  Changes to insurance: No  New side effects reported not previously addressed with a pharmacist or physician: None reported  Questions for the pharmacist: No    Confirmed patient received a Conservation officer, historic buildings and a Surveyor, mining with first shipment. The patient will receive a drug information handout for each medication shipped and additional FDA Medication Guides as required.       DISEASE/MEDICATION-SPECIFIC INFORMATION        N/A    SPECIALTY MEDICATION ADHERENCE     Medication Adherence    Patient reported X missed doses in the last month: 0  Specialty Medication: tenofovir  alafenamide: VEMLIDY  25 mg Tab tablet  Patient is on additional specialty medications: No              Were doses missed due to medication being on hold? No     tenofovir  alafenamide: VEMLIDY  25 mg Tab tablet: 10 days of medicine on hand       REFERRAL TO PHARMACIST     Referral to the pharmacist: Not needed      [facility]     Shipping address confirmed in Epic.     Cost and Payment: Patient has a $0 copay, payment information is not required.    Delivery Scheduled: Yes, Expected medication delivery date: 06/13/2024.     Medication will be delivered via UPS to the prescription address in Epic WAM.    [name]   [facility] Specialty and Home Delivery Pharmacy  Specialty Technician

## 2024-06-17 MED FILL — VEMLIDY 25 MG TABLET: ORAL | 30 days supply | Qty: 30 | Fill #1

## 2024-06-17 NOTE — Unmapped (Signed)
 Steven Nelson 's tenofovir  alafenamide: VEMLIDY  25 mg Tab tablet  shipment will be rescheduled as a result of Shipment problem in Wam     I have reached out to the patient  via incoming phone call and communicated the delivery change. We will reschedule the medication for the delivery date that the patient agreed upon.  We via ups.

## 2024-07-15 NOTE — Unmapped (Signed)
 [facility] Specialty and Home Delivery Pharmacy Refill Coordination Note    Specialty Medication(s) to be Shipped:   Infectious Disease: Vemlidy     Other medication(s) to be shipped: No additional medications requested for fill at this time    Specialty Medications not needed at this time: N/A     Steven Nelson Age, DOB: 13-Jun-1986  Phone: [phone] (home)       All above HIPAA information was verified with patient.     Was a Nurse, learning disability used for this call? No    Completed refill call assessment today to schedule patient's medication shipment from the [facility] and Home Delivery Pharmacy  [phone]).  All relevant notes have been reviewed.     Specialty medication(s) and dose(s) confirmed: Regimen is correct and unchanged.   Changes to medications: Cosme reports no changes at this time.  Changes to insurance: No  New side effects reported not previously addressed with a pharmacist or physician: None reported  Questions for the pharmacist: No    Confirmed patient received a Conservation officer, historic buildings and a Surveyor, mining with first shipment. The patient will receive a drug information handout for each medication shipped and additional FDA Medication Guides as required.       DISEASE/MEDICATION-SPECIFIC INFORMATION        N/A    SPECIALTY MEDICATION ADHERENCE     Medication Adherence    Patient reported X missed doses in the last month: 0  Specialty Medication: tenofovir  alafenamide (VEMLIDY ) 25 mg Tab tablet  Patient is on additional specialty medications: No  Informant: patient              Were doses missed due to medication being on hold? No    tenofovir  alafenamide (VEMLIDY ) 25 mg Tab tablet : 4-5 doses of medicine on hand       REFERRAL TO PHARMACIST     Referral to the pharmacist: Not needed      SHIPPING     Shipping address confirmed in Epic.     Cost and Payment: Patient has a $0 copay, payment information is not required.    Delivery Scheduled: Yes, Expected medication delivery date: 9/11.     Medication will be delivered via UPS to the prescription address in Epic WAM.    [name] Specialty and [facility]

## 2024-07-17 MED FILL — VEMLIDY 25 MG TABLET: ORAL | 30 days supply | Qty: 30 | Fill #2

## 2024-08-06 ENCOUNTER — Ambulatory Visit
Admission: RE | Admit: 2024-08-06 | Discharge: 2024-08-06 | Disposition: A | Discharge status: Home / Self Care | Payer: Self-pay | Source: Ambulatory Visit | Attending: Gastroenterology | Admitting: Gastroenterology

## 2024-08-06 DIAGNOSIS — B181 Chronic viral hepatitis B without delta-agent: Secondary | ICD-10-CM

## 2024-08-09 NOTE — Unmapped (Signed)
 [facility] Specialty and Home Delivery Pharmacy Refill Coordination Note    Specialty Medication(s) to be Shipped:   Infectious Disease: tenofovir  alafenamide: VEMLIDY  25 mg Tab tablet    Other medication(s) to be shipped: No additional medications requested for fill at this time    Specialty Medications not needed at this time: N/A     Steven Nelson, DOB: [birth year]  Phone: [phone] (home)       All above HIPAA information was verified with patient.     Was a Nurse, learning disability used for this call? No    Completed refill call assessment today to schedule patient's medication shipment from the [facility] and Home Delivery Pharmacy  [phone]).  All relevant notes have been reviewed.     Specialty medication(s) and dose(s) confirmed: Regimen is correct and unchanged.   Changes to medications: Jaasiel reports no changes at this time.  Changes to insurance: No  New side effects reported not previously addressed with a pharmacist or physician: None reported  Questions for the pharmacist: No    Confirmed patient received a Conservation officer, historic buildings and a Surveyor, mining with first shipment. The patient will receive a drug information handout for each medication shipped and additional FDA Medication Guides as required.       DISEASE/MEDICATION-SPECIFIC INFORMATION        N/A    SPECIALTY MEDICATION ADHERENCE     Medication Adherence    Patient reported X missed doses in the last month: 0  Specialty Medication: tenofovir  alafenamide: VEMLIDY  25 mg Tab tablet  Patient is on additional specialty medications: No  Informant: patient              Were doses missed due to medication being on hold? No    tenofovir  alafenamide: VEMLIDY  25 mg Tab tablet: 7 days of medicine on hand       REFERRAL TO PHARMACIST     Referral to the pharmacist: Not needed      [facility]     Shipping address confirmed in Epic.     Cost and Payment: Patient has a $0 copay, payment information is not required.    Delivery Scheduled: Yes, Expected medication delivery date: 10/8.     Medication will be delivered via UPS to the prescription address in Epic WAM.    [name] Specialty and [facility]

## 2024-08-13 MED FILL — VEMLIDY 25 MG TABLET: ORAL | 30 days supply | Qty: 30 | Fill #3

## 2024-09-04 DIAGNOSIS — B181 Chronic viral hepatitis B without delta-agent: Principal | ICD-10-CM

## 2024-09-06 NOTE — Progress Notes (Signed)
 [facility] Specialty and Home Delivery Pharmacy Refill Coordination Note    Specialty Medication(s) to be Shipped:   Infectious Disease: VEMLIDY  25 mg Tab tablet (tenofovir  alafenamide)    Other medication(s) to be shipped: No additional medications requested for fill at this time    Specialty Medications not needed at this time: N/A     Steven Nelson, DOB: 1986-08-23  Phone: [phone] (home)       All above HIPAA information was verified with patient.     Was a nurse, learning disability used for this call? No    Completed refill call assessment today to schedule patient's medication shipment from the [facility] and Home Delivery Pharmacy  [phone]).  All relevant notes have been reviewed.     Specialty medication(s) and dose(s) confirmed: Regimen is correct and unchanged.   Changes to medications: Steven Nelson reports no changes at this time.  Changes to insurance: No  New side effects reported not previously addressed with a pharmacist or physician: None reported  Questions for the pharmacist: No    Confirmed patient received a Conservation Officer, Historic Buildings and a Surveyor, Mining with first shipment. The patient will receive a drug information handout for each medication shipped and additional FDA Medication Guides as required.       DISEASE/MEDICATION-SPECIFIC INFORMATION        N/A    SPECIALTY MEDICATION ADHERENCE     Medication Adherence    Patient reported X missed doses in the last month: 0  Specialty Medication: VEMLIDY  25 mg Tab tablet (tenofovir  alafenamide)  Patient is on additional specialty medications: No  Informant: patient              Were doses missed due to medication being on hold? No     VEMLIDY  25 mg Tab tablet (tenofovir  alafenamide): 7 days of medicine on hand       REFERRAL TO PHARMACIST     Referral to the pharmacist: Not needed      [facility]     Shipping address confirmed in Epic.     Cost and Payment: Patient has a $0 copay, payment information is not required.    Delivery Scheduled: Yes, Expected medication delivery date: 11/5.     Medication will be delivered via UPS to the prescription address in Epic WAM.    Steven Nelson UNK Specialty and [facility]

## 2024-09-10 MED FILL — VEMLIDY 25 MG TABLET: ORAL | 30 days supply | Qty: 30 | Fill #4

## 2024-09-19 ENCOUNTER — Ambulatory Visit: Admit: 2024-09-19 | Discharge: 2024-09-20 | Attending: Gastroenterology | Primary: Gastroenterology

## 2024-09-19 DIAGNOSIS — B181 Chronic viral hepatitis B without delta-agent: Principal | ICD-10-CM

## 2024-09-19 LAB — HEPATITIS B DNA, QUANTITATIVE, PCR
HBV DNA QUANT: DETECTED — AB
HBV DNA: <10 [IU]/mL — ABNORMAL HIGH (ref <=0)

## 2024-09-19 LAB — CBC
HEMATOCRIT: 46.8 % (ref 39.0–48.0)
HEMOGLOBIN: 15.5 g/dL (ref 12.9–16.5)
MEAN CORPUSCULAR HEMOGLOBIN CONC: 33.2 g/dL (ref 32.0–36.0)
MEAN CORPUSCULAR HEMOGLOBIN: 24.9 pg — ABNORMAL LOW (ref 25.9–32.4)
MEAN CORPUSCULAR VOLUME: 74.9 fL — ABNORMAL LOW (ref 77.6–95.7)
MEAN PLATELET VOLUME: 8.5 fL (ref 6.8–10.7)
PLATELET COUNT: 220 10*9/L (ref 150–450)
RED BLOOD CELL COUNT: 6.25 10*12/L — ABNORMAL HIGH (ref 4.26–5.60)
RED CELL DISTRIBUTION WIDTH: 14.8 % (ref 12.2–15.2)
WBC ADJUSTED: 5.7 10*9/L (ref 3.6–11.2)

## 2024-09-19 LAB — COMPREHENSIVE METABOLIC PANEL
ALBUMIN: 4.2 g/dL (ref 3.4–5.0)
ALKALINE PHOSPHATASE: 82 U/L (ref 46–116)
ALT (SGPT): 23 U/L (ref 10–49)
ANION GAP: 17 mmol/L — ABNORMAL HIGH (ref 5–14)
AST (SGOT): 21 U/L (ref <=34)
BILIRUBIN TOTAL: 0.4 mg/dL (ref 0.3–1.2)
BLOOD UREA NITROGEN: 13 mg/dL (ref 9–23)
BUN / CREAT RATIO: 13
CALCIUM: 9.5 mg/dL (ref 8.7–10.4)
CHLORIDE: 100 mmol/L (ref 98–107)
CO2: 26.2 mmol/L (ref 20.0–31.0)
CREATININE: 0.98 mg/dL (ref 0.73–1.18)
EGFR CKD-EPI (2021) MALE: >90 mL/min/1.73m2 (ref >=60)
GLUCOSE RANDOM: 127 mg/dL (ref 70–179)
POTASSIUM: 3.5 mmol/L (ref 3.4–4.8)
PROTEIN TOTAL: 7.7 g/dL (ref 5.7–8.2)
SODIUM: 143 mmol/L (ref 135–145)

## 2024-09-19 LAB — AFP TUMOR MARKER: AFP-TUMOR MARKER: <2 ng/mL (ref <=8)

## 2024-09-19 NOTE — Progress Notes (Signed)
 [facility]    Ozell MICAEL Ash, M.D.  Professor of Medicine  Division of GI and Hepatology  [employer]  at [facility]    [phone]        Name:Steven Nelson   FMWN:[id number]   Age:38 y.o.   Sex:M   Race:Asian       CHIEF COMPLAINT: Follow-up after completing HBRN IA protocol.     HISTORY OF PRESENT ILLNESS: Steven Nelson is a 38 y.o.  [place] male with chronic HBV originally HBeAg + disease. He was in HBV cohort study then transitioned to treatment. His first PEG injection 03/30/12 for immune active treatment trial, randomized PEG and tenofovir  PEG reduced to 135 mcg for side effects including weight loss. He completed PEG at week 24. He has continued per protocol on tenofovir  alone. He has persistently undetectable HBV DNA although his Hep Be Ag status fluctuates from positive to negative at various times.      Interval history: Patient feels well. Denies N/V abd pain, chest pain, or SOB. Working full time. 100% adherent to Vemlidy  (switched from tenofovir  on June 2019).   Going to [place] in April to get engaged with wedding planned for next year.    PAST MEDICAL HISTORY:   1. Chronic hepatitis B, genotype C.   2. Dyslipidemia.   3. Lactose intolerance   4. 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   5. HCC surveillance: 09/2024- No masses.    6. HAV immune  7. 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 Does not drink or smoke.    FAMILY HISTORY: There is no family history of hepatocellular carcinoma.     No Known Allergies    Current Outpatient Medications   Medication Sig Dispense Refill    tenofovir  alafenamide (VEMLIDY ) 25 mg Tab tablet Take 1 tablet (25 mg total) by mouth daily.   TAKE WITH FOOD 90 tablet 1     No current facility-administered medications for this visit.         PHYSICAL EXAMINATION:   BP 108/68 (BP Position: Sitting)  - Pulse 92  - Temp 36.4 ??C (97.6 ??F) - Ht 167.6 cm (5' 6)  - Wt 64.4 kg (142 lb)  - SpO2 100%  - BMI 22.92 kg/m??         GENERAL: This is a thin Vietnamese male in no acute distress.   Pleasant individual in NAD  HEENT: Sclera are anicteric, no temporal muscle loss  NECK: No thyromegaly or lymphadenopathy, No carotid bruits  Abdomen: Soft, non-tender, non-distended, no hepatosplenomegaly, no masses appreciated, no ascites  Skin: No spider angiomata, No rashes  Extremities: Without pedal edema, no palmar erythema  Neuro: Grossly intact, No focal deficits      Results for orders placed or performed in visit on 09/19/24   Hepatitis B e Antibody   Result Value Ref Range    Hep B E Ab Negative Negative   Hepatitis B e Antigen   Result Value Ref Range    Hep B E Ag Negative Negative   Hepatitis B DNA, Quantitative, PCR   Result Value Ref Range    HBV DNA Quant Detected (A) Not Detected    HBV DNA <10 (H) 0 IU/mL    HBV DNA Log10     AFP tumor marker   Result Value Ref Range  AFP-Tumor Marker <2 <=8 ng/mL   Comprehensive Metabolic Panel   Result Value Ref Range    Sodium 143 135 - 145 mmol/L    Potassium 3.5 3.4 - 4.8 mmol/L    Chloride 100 98 - 107 mmol/L    CO2 26.2 20.0 - 31.0 mmol/L    Anion Gap 17 (H) 5 - 14 mmol/L    BUN 13 9 - 23 mg/dL    Creatinine 9.01 9.26 - 1.18 mg/dL    BUN/Creatinine Ratio 13     eGFR CKD-EPI (2021) Male >90 >=60 mL/min/1.67m2    Glucose 127 70 - 179 mg/dL    Calcium 9.5 8.7 - 89.5 mg/dL    Albumin 4.2 3.4 - 5.0 g/dL    Total Protein 7.7 5.7 - 8.2 g/dL    Total Bilirubin 0.4 0.3 - 1.2 mg/dL    AST 21 <=65 U/L    ALT 23 10 - 49 U/L    Alkaline Phosphatase 82 46 - 116 U/L   CBC   Result Value Ref Range    WBC 5.7 3.6 - 11.2 10*9/L    RBC 6.25 (H) 4.26 - 5.60 10*12/L    HGB 15.5 12.9 - 16.5 g/dL    HCT 53.1 60.9 - 51.9 %    MCV 74.9 (L) 77.6 - 95.7 fL    MCH 24.9 (L) 25.9 - 32.4 pg    MCHC 33.2 32.0 - 36.0 g/dL    RDW 85.1 87.7 - 84.7 %    MPV 8.5 6.8 - 10.7 fL    Platelet 220 150 - 450 10*9/L         Impression:    1.  Chronic hepatitis B: Patient was participant in the [facility] immune active clinical trial.  He was treated with tenofovir .  He was switched to Vemlidy  in June 2019.  He continues on this medication.  I reinforced the importance of ongoing follow-up every 6 months.     2. HCC surveillance: U/S 09/2024- No masses. I reinforced importance of q6month ultrasound screening. Repeat ordered for [facility] imaging.    3. 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 HGB electrophoresis confirmed hemoglobin E trait.    4. Pending engagement: Dola is from [place]. She should be tested for HBsAg, anti-HBcore, anti-HBs to determine expsoure or vaccination status.     Patient will return for follow-up in 6 months.        Ozell MICAEL Ash, M.D., FAASLD  Professor of Medicine  Division of GI and Hepatology  [employer]   at [facility]    [phone]

## 2024-09-19 NOTE — Patient Instructions (Addendum)
 Chronic hepatitis B, doing well on Vemlidy . Continue this medication.  Recent ultrasound looked fine,  Repeat ordered for [facility] in 6   months around time of next visit. Your Mom needs to be tested for hepatitis B.

## 2024-09-21 LAB — HEPATITIS B E ANTIGEN: HEPATITIS BE ANTIGEN: NEGATIVE

## 2024-09-21 LAB — HEPATITIS B E ANTIBODY: HEPATITIS BE ANTIBODY: NEGATIVE

## 2024-09-30 NOTE — Progress Notes (Signed)
 [facility] Refill Coordination Note    Specialty Medication(s) to be Shipped:   Infectious Disease: VEMLIDY  25 mg Tab tablet (tenofovir  alafenamide)    Other medication(s) to be shipped: No additional medications requested for fill at this time    Specialty Medications not needed at this time: N/A     Steven Nelson, DOB: [birth year]  Phone: [phone] (home)       All above HIPAA information was verified with patient.     Was a nurse, learning disability used for this call? No    Completed refill call assessment today to schedule patient's medication shipment from the [facility]  [phone]).  All relevant notes have been reviewed.     Specialty medication(s) and dose(s) confirmed: Regimen is correct and unchanged.   Changes to medications: Steven Nelson reports no changes at this time.  Changes to insurance: No  New side effects reported not previously addressed with a pharmacist or physician: None reported  Questions for the pharmacist: No    Confirmed patient received a Conservation Officer, Historic Buildings and a Surveyor, Mining with first shipment. The patient will receive a drug information handout for each medication shipped and additional FDA Medication Guides as required.       DISEASE/MEDICATION-SPECIFIC INFORMATION        N/A    SPECIALTY MEDICATION ADHERENCE     Medication Adherence    Patient reported X missed doses in the last month: 0  Specialty Medication: VEMLIDY  25 mg Tab tablet (tenofovir  alafenamide)  Patient is on additional specialty medications: No  Informant: patient              Were doses missed due to medication being on hold? No    VEMLIDY  25 mg Tab tablet (tenofovir  alafenamide) : 7-10 days of medicine on hand       REFERRAL TO PHARMACIST     Referral to the pharmacist: Not needed      [facility]     Shipping address confirmed in Epic.     Cost and Payment: Patient has a $0 copay, payment information is not required.    Delivery Scheduled: Yes, Expected medication delivery date: 12/3.     Medication will be delivered via UPS to the prescription address in Epic WAM.    Steven Nelson [facility]

## 2024-10-08 MED FILL — VEMLIDY 25 MG TABLET: ORAL | 30 days supply | Qty: 30 | Fill #5

## 2024-10-29 DIAGNOSIS — B181 Chronic viral hepatitis B without delta-agent: Principal | ICD-10-CM

## 2024-10-29 MED ORDER — VEMLIDY 25 MG TABLET
ORAL_TABLET | Freq: Every day | ORAL | 1 refills | 90.00000 days | Status: CP
Start: 2024-10-29 — End: ?
  Filled 2024-11-04: qty 30, 30d supply, fill #0

## 2024-10-29 NOTE — Telephone Encounter (Signed)
 Refill request Vemlidy  25 mg     LCV 09/19/2024 with labs    Will refill medication per policy

## 2024-10-29 NOTE — Progress Notes (Signed)
 [facility] and Home Delivery Pharmacy Refill Coordination Note    Specialty Medication(s) to be Shipped:   Infectious Disease: Vemlidy     Other medication(s) to be shipped: No additional medications requested for fill at this time    Specialty Medications not needed at this time: N/A     Steven Nelson, DOB: 1986/05/12  Phone: [phone] (home)       All above HIPAA information was verified with patient.     Was a nurse, learning disability used for this call? No    Completed refill call assessment today to schedule patient's medication shipment from the [facility] and [facility] Delivery Pharm[facility]  [phone]).  All relevant notes have been reviewed.     Specialty medication(s) and dose(s) confirmed: Regimen is correct and unchanged.   Changes to medications: Steven Nelson reports no changes at this time.  Changes to insurance: No  New side effects reported not previously addressed with a pharmacist or physician: None reported  Questions for the pharmacist: No    Confirmed patient received a Conservation Officer, Historic Buildings and a Surveyor, Mining with first shipment. The patient will receive a drug information handout for each medication shipped and additional FDA Medication Guides as required.       DISEASE/MEDICATION-SPECIFIC INFORMATION        N/A    SPECIALTY MEDICATION ADHERENCE     Medication Adherence    Patient reported X missed doses in the last month: 0  Specialty Medication: VEMLIDY  25 mg Tab tablet (tenofovir  alafenamide)  Patient is on additional specialty medications: No              Were doses missed due to medication being on hold? No     tenofovir  alafenamide: VEMLIDY  25 mg Tab tablet: 9 days of medicine on hand       Specialty medication is an injection or given on a cycle: No    REFERRAL TO PHARMACIST     Referral to the pharmacist: Not needed      [facility]     Shipping address confirmed in Epic.     Cost and Payment: Patient has a $0 copay, payment information is not required.    Delivery Scheduled: Yes, Expected medication delivery date: 11/05/2024.  However, Rx request for refills was sent to the provider as there are none remaining.     Medication will be delivered via UPS to the prescription address in Epic WAM.    Steven Nelson   [facility]  Specialty Technician

## 2024-11-28 DIAGNOSIS — B181 Chronic viral hepatitis B without delta-agent: Principal | ICD-10-CM

## 2024-11-28 MED ORDER — VEMLIDY 25 MG TABLET
ORAL_TABLET | Freq: Every day | ORAL | 3 refills | 90.00000 days | Status: CP
Start: 2024-11-28 — End: ?

## 2024-11-28 NOTE — Progress Notes (Signed)
 [name] has been contacted in regards to their refill of Vemlidy . At this time, we are unable to schedule a refill due to waiting on manufacturer assistance renewal. Once approved, [facility] will not be able to fill Vemlidy , and patient must use [facility] Support Path). Patient is aware of pending application and that refills will have to go through other pharmacy going forward.

## 2024-11-28 NOTE — Progress Notes (Signed)
 Hepatology Clinic Pharmacist Note    Primary Hepatology provider: Dr. Brien  Diagnosis: Chronic Hep B    Current regimen: Vemlidy  25mg  daily with food  Pharmacy: [facility] [phone]    [facility] assistance approved til 11/06/25. Pt must fill through [facility]. Rx sent.    Steven Nelson, Pharm D., BCPS, BCGP, CPP  [facility]  [address]  [place], [place] [address]  [phone]

## 2024-12-04 NOTE — Progress Notes (Signed)
 Specialty Medication(s): Vemlidy     Mr.Weesner has been dis-enrolled from the [facility] specialty pharmacy services as a result of enrollment in a manufacturer assistance program that sends medicine directly to the patient.    Additional information provided to the patient: Vemlidy  Manufacturer Assistance Program: [employer] (support path) Coverage Dates: 11/28/2024 through 11/06/2025. The requested prescription above will be made available through the manufacturer's free drug program and will be shipped directly to the patient via the manufacturer's contracted specialty pharmacy.  A representative from the manufacturer program will reach out to the patient to schedule a delivery.  Patient can follow up with manufacturer if needed via phone# @ [phone].     In the event of a dose change for the approved medication or if additional refills are needed the clinic team can contact the following:  [facility])  Phone Number: [phone]  Fax : [phone]       Aleck CHRISTELLA Gaskins, PharmD  [facility] and [facility] Delivery Pharmacy Specialty Pharmacist
